# Patient Record
Sex: Female | Born: 1990 | Race: Black or African American | Hispanic: No | Marital: Single | Smoking: Never smoker
Health system: Southern US, Community
[De-identification: ages and names within clinical notes are randomized; demographics above are authoritative.]

## PROBLEM LIST (undated history)

## (undated) DIAGNOSIS — K219 Gastro-esophageal reflux disease without esophagitis: Secondary | ICD-10-CM

## (undated) DIAGNOSIS — D649 Anemia, unspecified: Secondary | ICD-10-CM

## (undated) DIAGNOSIS — R079 Chest pain, unspecified: Secondary | ICD-10-CM

## (undated) DIAGNOSIS — D219 Benign neoplasm of connective and other soft tissue, unspecified: Secondary | ICD-10-CM

## (undated) DIAGNOSIS — J45909 Unspecified asthma, uncomplicated: Secondary | ICD-10-CM

## (undated) HISTORY — PX: UPPER GI ENDOSCOPY: SHX6162

## (undated) HISTORY — PX: WISDOM TOOTH EXTRACTION: SHX21

## (undated) HISTORY — PX: TONSILLECTOMY: SUR1361

---

## 2012-02-27 ENCOUNTER — Encounter (HOSPITAL_COMMUNITY): Payer: Self-pay | Admitting: Emergency Medicine

## 2012-02-27 ENCOUNTER — Emergency Department (HOSPITAL_COMMUNITY)
Admission: EM | Admit: 2012-02-27 | Discharge: 2012-02-27 | Disposition: A | Discharge status: Home / Self Care | Payer: 59 | Source: Home / Self Care | Attending: Emergency Medicine | Admitting: Emergency Medicine

## 2012-02-27 DIAGNOSIS — N758 Other diseases of Bartholin's gland: Secondary | ICD-10-CM

## 2012-02-27 DIAGNOSIS — N72 Inflammatory disease of cervix uteri: Secondary | ICD-10-CM

## 2012-02-27 DIAGNOSIS — N7689 Other specified inflammation of vagina and vulva: Secondary | ICD-10-CM

## 2012-02-27 LAB — POCT URINALYSIS DIP (DEVICE)
Leukocytes, UA: NEGATIVE
Protein, ur: 30 mg/dL — AB
Specific Gravity, Urine: >=1.03 (ref 1.005–1.030)
pH: 6 (ref 5.0–8.0)

## 2012-02-27 LAB — POCT PREGNANCY, URINE: Preg Test, Ur: NEGATIVE

## 2012-02-27 NOTE — ED Notes (Signed)
Reports a cyst in vaginal area.  Reports minimal discharge that patient reports as "normal " for her.  Cyst is reported as present for months

## 2012-02-27 NOTE — ED Provider Notes (Signed)
History     CSN: [id number]  Arrival date & time 02/27/12  1330   First MD Initiated Contact with Patient 02/27/12 1343      Chief Complaint  Patient presents with  . Cyst    (Consider location/radiation/quality/duration/timing/severity/associated sxs/prior treatment) HPI Comments: Patient presents this afternoon to urgent care complaining of an ongoing or recurrent cyst in her vaginal area that comes and go for several months. The last few days she felt any new lead develop cyst and then a significant amount of drainage after that. Comes in because she wants to be checked also know what this cyst was. Patient denies any vaginal bleeding, pelvic pain fevers and only admits to a discrete vaginal discharge.  The history is provided by the patient.    History reviewed. No pertinent past medical history.  History reviewed. No pertinent past surgical history.  No family history on file.  History  Substance Use Topics  . Smoking status: Never Smoker   . Smokeless tobacco: Not on file  . Alcohol Use: Yes    OB History    Grav Para Term Preterm Abortions TAB SAB Ect Mult Living                  Review of Systems  Constitutional: Negative for fever, activity change and fatigue.  Genitourinary: Positive for vaginal discharge and vaginal pain. Negative for dysuria, frequency, flank pain, decreased urine volume, vaginal bleeding, genital sores and pelvic pain.  Musculoskeletal: Negative for arthralgias.    Allergies  Shellfish allergy  Home Medications   Current Outpatient Rx  Name Route Sig Dispense Refill  . OMEPRAZOLE 20 MG PO CPDR Oral Take 20 mg by mouth daily.      BP 147/84  Pulse 68  Temp 99.3 F (37.4 C) (Oral)  Resp 16  SpO2 100%  LMP 02/07/2012  Physical Exam  Nursing note and vitals reviewed. Constitutional: Vital signs are normal. She appears well-developed and well-nourished.  Non-toxic appearance. She does not have a sickly appearance. She does not  appear ill. No distress.  HENT:  Head: Normocephalic.  Pulmonary/Chest: Effort normal and breath sounds normal. She has no decreased breath sounds.  Abdominal: Soft. Bowel sounds are normal. She exhibits no distension and no mass. There is no tenderness. There is no rebound and no guarding. Hernia confirmed negative in the left inguinal area.  Genitourinary: Vagina normal. There is tenderness on the left labia. There is no rash, lesion or injury on the left labia.  Lymphadenopathy:       Left: No inguinal adenopathy present.  Neurological: She is alert.  Skin: Skin is warm. No rash noted. No erythema.    ED Course  Procedures (including critical care time)  Labs Reviewed  POCT URINALYSIS DIP (DEVICE) - Abnormal; Notable for the following:    Bilirubin Urine SMALL (*)     Ketones, ur TRACE (*)     Protein, ur 30 (*)     All other components within normal limits  POCT PREGNANCY, URINE   No results found.   1. Bartholin's adenitis       MDM  External genitalia exam did not reveal the presence of a Bartholin cyst or abscess. No genital sores or rashes were seen on exam. Patient admits that she is no longer feeling any bump or swelling in the area.        Jimmie Molly, MD 02/27/12 1630

## 2014-12-13 ENCOUNTER — Encounter (HOSPITAL_COMMUNITY): Payer: Self-pay | Admitting: Emergency Medicine

## 2014-12-13 ENCOUNTER — Emergency Department (INDEPENDENT_AMBULATORY_CARE_PROVIDER_SITE_OTHER)
Admission: EM | Admit: 2014-12-13 | Discharge: 2014-12-13 | Disposition: A | Discharge status: Home / Self Care | Payer: Worker's Compensation | Source: Home / Self Care

## 2014-12-13 DIAGNOSIS — IMO0001 Reserved for inherently not codable concepts without codable children: Secondary | ICD-10-CM

## 2014-12-13 DIAGNOSIS — S6981XA Other specified injuries of right wrist, hand and finger(s), initial encounter: Secondary | ICD-10-CM | POA: Diagnosis not present

## 2014-12-13 LAB — RAPID HIV SCREEN (HIV 1/2 AB+AG)
HIV 1/2 Antibodies: NONREACTIVE
HIV-1 P24 ANTIGEN - HIV24: NONREACTIVE

## 2014-12-13 NOTE — Discharge Instructions (Signed)
Keep area clean and covered. We will call you with results. Follow up with your [facility] provider as directed by your employer. Use your PPE, refrain from placing your hands into sharps containers or attempting to recap needles, etc. Return as needed.

## 2014-12-13 NOTE — ED Notes (Signed)
Per provider advise patient that rapid test was negative. Patient is aware and verbally understands

## 2014-12-13 NOTE — ED Provider Notes (Signed)
CSN: [id number]     Arrival date & time 12/13/14  1805 History   None    Chief Complaint  Patient presents with  . Body Fluid Exposure   (Consider location/radiation/quality/duration/timing/severity/associated sxs/prior Treatment) HPI Comments: Pt states she stuck her hand in sharps container and got stuck with a needle at work, noticed when she went to shred papers and used hand sanitizer and noticed burning sensation. Washed hands thoroughly and notified employer. Incident occurred at ~5:15 pm today, pt is right handed. Noted single puncture wound to right index finger distal aspect, bleeding stopped. Pt has pw covered with bandaid. Pt is right hand dominant, tetanus up to date, has had Hepatitis series completed for work. Pt states she recently had HIV test as well that was negative. Unknown patient source. Discussed plan of care with Dr. Bridgett Larsson and pt. Acute hepatitis and HIV test performed.   The history is provided by the patient. No language interpreter was used.    History reviewed. No pertinent past medical history. History reviewed. No pertinent past surgical history. History reviewed. No pertinent family history. Social History  Substance Use Topics  . Smoking status: Never Smoker   . Smokeless tobacco: None  . Alcohol Use: Yes   OB History    No data available     Review of Systems  Constitutional: Negative for fever, chills and activity change.  HENT: Negative.   Eyes: Negative.   Respiratory: Negative.   Cardiovascular: Negative.   Gastrointestinal: Negative.   Endocrine: Negative.   Genitourinary: Negative.   Musculoskeletal: Negative.   Skin: Positive for wound. Negative for color change, pallor and rash.  Allergic/Immunologic: Negative.   Neurological: Negative.   Hematological: Negative.   Psychiatric/Behavioral: The patient is nervous/anxious.   All other systems reviewed and are negative.   Allergies  Shellfish allergy  Home Medications   Prior to  Admission medications   Medication Sig Start Date End Date Taking? Authorizing Provider  omeprazole (PRILOSEC) 20 MG capsule Take 20 mg by mouth daily.    Historical Provider, MD   BP 137/81 mmHg  Pulse 68  Temp(Src) 97.4 F (36.3 C) (Oral)  Resp 16  SpO2 99%  LMP 11/14/2014 Physical Exam  Constitutional: She is oriented to person, place, and time. Vital signs are normal. She appears well-developed and well-nourished. She is active and cooperative.  Non-toxic appearance. She does not have a sickly appearance. She does not appear ill. She appears distressed.  HENT:  Head: Normocephalic.  Eyes: Pupils are equal, round, and reactive to light.  Neck: Thyromegaly present.  Cardiovascular: Normal rate, regular rhythm, normal heart sounds, intact distal pulses and normal pulses.   Pulses:      Radial pulses are 2+ on the right side, and 2+ on the left side.  Pulmonary/Chest: Effort normal.  Musculoskeletal: Normal range of motion. She exhibits tenderness. She exhibits no edema.       Right hand: She exhibits tenderness. She exhibits normal range of motion, no bony tenderness, normal two-point discrimination, normal capillary refill, no deformity, no laceration and no swelling. Normal sensation noted. Normal strength noted.       Hands: Neurological: She is alert and oriented to person, place, and time. She has normal strength. No cranial nerve deficit or sensory deficit. GCS eye subscore is 4. GCS verbal subscore is 5. GCS motor subscore is 6.  Skin: Skin is warm and dry. No erythema.  Single pw to right index finger as charted on diagram  Psychiatric: Her  speech is normal. Her mood appears anxious.  Nursing note and vitals reviewed.   ED Course  Procedures (including critical care time) Labs Review Labs Reviewed  RAPID HIV SCREEN (HIV 1/2 AB+AG)  HEPATITIS PANEL, ACUTE   Results for orders placed or performed during the hospital encounter of 12/13/14  Rapid HIV screen (HIV 1/2 Ab+Ag)   Result Value Ref Range   HIV-1 P24 Antigen - HIV24 NON REACTIVE NON REACTIVE   HIV 1/2 Antibodies NON REACTIVE NON REACTIVE   Interpretation (HIV Ag Ab)      A non reactive test result means that HIV 1 or HIV 2 antibodies and HIV 1 p24 antigen were not detected in the specimen.    Imaging Review No results found.   MDM   1. Needlestick wound of finger, right, initial encounter     Tetanus is current. Keep wound clean and dry. Watch for s/s of infection(redness, pus drainage, fever, etc. If develops will need further evaluation and antibiotics at that time. Pt called with negative results. To follow up with [facility] regarding additional serial testing. Discussed use of PPE and avoidance of recapping needles or sticking hand into sharps container. Pt verbalized understanding to this provider.   Tori Milks, NP 80/88/11 0315

## 2014-12-13 NOTE — ED Notes (Addendum)
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

## 2014-12-14 ENCOUNTER — Encounter (HOSPITAL_COMMUNITY): Payer: Self-pay | Admitting: Emergency Medicine

## 2014-12-14 ENCOUNTER — Emergency Department (HOSPITAL_COMMUNITY)
Admission: EM | Admit: 2014-12-14 | Discharge: 2014-12-15 | Disposition: A | Discharge status: Home / Self Care | Payer: Commercial Managed Care - HMO | Attending: Emergency Medicine | Admitting: Emergency Medicine

## 2014-12-14 DIAGNOSIS — Z86018 Personal history of other benign neoplasm: Secondary | ICD-10-CM | POA: Diagnosis not present

## 2014-12-14 DIAGNOSIS — Z3202 Encounter for pregnancy test, result negative: Secondary | ICD-10-CM | POA: Insufficient documentation

## 2014-12-14 DIAGNOSIS — Z79818 Long term (current) use of other agents affecting estrogen receptors and estrogen levels: Secondary | ICD-10-CM | POA: Diagnosis not present

## 2014-12-14 DIAGNOSIS — R102 Pelvic and perineal pain: Secondary | ICD-10-CM | POA: Diagnosis present

## 2014-12-14 DIAGNOSIS — N72 Inflammatory disease of cervix uteri: Secondary | ICD-10-CM | POA: Diagnosis not present

## 2014-12-14 HISTORY — DX: Benign neoplasm of connective and other soft tissue, unspecified: D21.9

## 2014-12-14 LAB — URINALYSIS, ROUTINE W REFLEX MICROSCOPIC
BILIRUBIN URINE: NEGATIVE
GLUCOSE, UA: NEGATIVE mg/dL
HGB URINE DIPSTICK: NEGATIVE
Ketones, ur: NEGATIVE mg/dL
Leukocytes, UA: NEGATIVE
Nitrite: NEGATIVE
PH: 5.5 (ref 5.0–8.0)
Protein, ur: NEGATIVE mg/dL
SPECIFIC GRAVITY, URINE: 1.012 (ref 1.005–1.030)
UROBILINOGEN UA: 0.2 mg/dL (ref 0.0–1.0)

## 2014-12-14 LAB — HEPATITIS PANEL, ACUTE
HCV Ab: 0.8 s/co ratio (ref 0.0–0.9)
Hep A IgM: NEGATIVE
Hep B C IgM: NEGATIVE
Hepatitis B Surface Ag: NEGATIVE

## 2014-12-14 LAB — POC URINE PREG, ED: Preg Test, Ur: NEGATIVE

## 2014-12-14 LAB — WET PREP, GENITAL
CLUE CELLS WET PREP: NONE SEEN
TRICH WET PREP: NONE SEEN
YEAST WET PREP: NONE SEEN

## 2014-12-14 MED ORDER — CEFTRIAXONE SODIUM 250 MG IJ SOLR
250.0000 mg | Freq: Once | INTRAMUSCULAR | Status: AC
  Administered 2014-12-15: 250 mg via INTRAMUSCULAR
  Filled 2014-12-14: qty 250

## 2014-12-14 MED ORDER — LIDOCAINE HCL (PF) 1 % IJ SOLN
INTRAMUSCULAR | Status: AC
  Administered 2014-12-15: 0.9 mL
  Filled 2014-12-14: qty 5

## 2014-12-14 MED ORDER — AZITHROMYCIN 250 MG PO TABS
1000.0000 mg | ORAL_TABLET | Freq: Once | ORAL | Status: AC
  Administered 2014-12-15: 1000 mg via ORAL
  Filled 2014-12-14: qty 4

## 2014-12-14 NOTE — Discharge Instructions (Signed)
No intercourse. You were treated for possible vaginal infection. When your cultures and labs come back, you will be contacted. Follow up as needed.    Cervicitis Cervicitis is a soreness and swelling (inflammation) of the cervix. Your cervix is located at the bottom of your uterus. It opens up to the vagina. CAUSES   Sexually transmitted infections (STIs).   Allergic reaction.   Medicines or birth control devices that are put in the vagina.   Injury to the cervix.   Bacterial infections.  RISK FACTORS You are at greater risk if you:  Have unprotected sexual intercourse.  Have sexual intercourse with many partners.  Began sexual intercourse at an early age.  Have a history of STIs. SYMPTOMS  There may be no symptoms. If symptoms occur, they may include:   Gray, white, yellow, or bad-smelling vaginal discharge.   Pain or itching of the area outside the vagina.   Painful sexual intercourse.   Lower abdominal or lower back pain, especially during intercourse.   Frequent urination.   Abnormal vaginal bleeding between periods, after sexual intercourse, or after menopause.   Pressure or a heavy feeling in the pelvis.  DIAGNOSIS  Diagnosis is made after a pelvic exam. Other tests may include:   Examination of any discharge under a microscope (wet prep).   A Pap test.  TREATMENT  Treatment will depend on the cause of cervicitis. If it is caused by an STI, both you and your partner will need to be treated. Antibiotic medicines will be given.  HOME CARE INSTRUCTIONS   Do not have sexual intercourse until your health care provider says it is okay.   Do not have sexual intercourse until your partner has been treated, if your cervicitis is caused by an STI.   Take your antibiotics as directed. Finish them even if you start to feel better.  SEEK MEDICAL CARE IF:  Your symptoms come back.   You have a fever.  MAKE SURE YOU:   Understand these  instructions.  Will watch your condition.  Will get help right away if you are not doing well or get worse. Document Released: 04/15/2005 Document Revised: 04/20/2013 Document Reviewed: 10/07/2012 [facility] Patient Information 2015 [place], [place]. This information is not intended to replace advice given to you by your health care provider. Make sure you discuss any questions you have with your health care provider.

## 2014-12-14 NOTE — ED Notes (Signed)
Pt presents reporting fibroid diagnosis since April '16, takes [facility] for same. Reports brown spotting during menstrual. Sexual intercourse Saturday and noticed bleeding afterwards and now reports "seeing a bump".

## 2014-12-14 NOTE — ED Provider Notes (Signed)
CSN: [id number]     Arrival date & time 12/14/14  2015 History   First MD Initiated Contact with Patient 12/14/14 2156     No chief complaint on file.    (Consider location/radiation/quality/duration/timing/severity/associated sxs/prior Treatment) HPI Lori Carson is a 24 y.o. female with history of uterine fibroids, presents to emergency department with a rash to her genital area and vaginal pain. Patient states she had pain for the last week. States she had intercourse several days ago during which she states she does not have any pain but had some bleeding after. She states today she looked at her vulva and noted a bump. She denies bump being painful. She denies any urinary symptoms. She says she has had irregular periods for several months. She has been on birth control for 3 months and states she was told she is bleeding because of a fibroid. She denies any fever or chills. No abdominal pain. No back pain. No other complaints.  Past Medical History  Diagnosis Date  . Fibroids    History reviewed. No pertinent past surgical history. No family history on file. Social History  Substance Use Topics  . Smoking status: Never Smoker   . Smokeless tobacco: None  . Alcohol Use: Yes   OB History    No data available     Review of Systems  Constitutional: Negative for fever and chills.  Respiratory: Negative for cough, chest tightness and shortness of breath.   Cardiovascular: Negative for chest pain, palpitations and leg swelling.  Gastrointestinal: Negative for nausea, vomiting, abdominal pain and diarrhea.  Genitourinary: Positive for vaginal bleeding, vaginal discharge and vaginal pain. Negative for dysuria, flank pain and pelvic pain.  Musculoskeletal: Negative for myalgias, arthralgias, neck pain and neck stiffness.  Skin: Negative for rash.  Neurological: Negative for dizziness, weakness and headaches.  All other systems reviewed and are negative.     Allergies   Shellfish allergy  Home Medications   Prior to Admission medications   Medication Sig Start Date End Date Taking? Authorizing Provider  naproxen (NAPROSYN) 250 MG tablet Take 500 mg by mouth 2 (two) times daily as needed for mild pain.   Yes Historical Provider, MD  norethindrone-ethinyl estradiol-iron (MICROGESTIN FE 1.5/30) 1.5-30 MG-MCG tablet Take 1 tablet by mouth every morning.   Yes Historical Provider, MD   BP 139/73 mmHg  Pulse 67  Temp(Src) 98 F (36.7 C) (Oral)  Resp 16  SpO2 100%  LMP 12/12/2014 Physical Exam  Constitutional: She is oriented to person, place, and time. She appears well-developed and well-nourished. No distress.  HENT:  Head: Normocephalic.  Eyes: Conjunctivae are normal.  Neck: Neck supple.  Cardiovascular: Normal rate, regular rhythm and normal heart sounds.   Pulmonary/Chest: Effort normal and breath sounds normal. No respiratory distress. She has no wheezes. She has no rales.  Abdominal: Soft. Bowel sounds are normal. She exhibits no distension. There is no tenderness. There is no rebound.  Genitourinary:  Normal external genitalia. No rash or lesions.  Normal vaginal canal. Small thin white discharge. Cervix is erythematous, closed. No CMT. No uterine or adnexal tenderness. No masses palpated.    Musculoskeletal: She exhibits no edema.  Neurological: She is alert and oriented to person, place, and time.  Skin: Skin is warm and dry.  Psychiatric: She has a normal mood and affect. Her behavior is normal.  Nursing note and vitals reviewed.   ED Course  Procedures (including critical care time) Labs Review Labs Reviewed  WET PREP, GENITAL -  Abnormal; Notable for the following:    WBC, Wet Prep HPF POC MANY (*)    All other components within normal limits  URINALYSIS, ROUTINE W REFLEX MICROSCOPIC (NOT AT [facility])  RPR  HIV ANTIBODY (ROUTINE TESTING)  POC URINE PREG, ED  GC/CHLAMYDIA PROBE AMP ([place]) NOT AT [facility]    Imaging Review No  results found. I have personally reviewed and evaluated these images and lab results as part of my medical decision-making.   EKG Interpretation None      MDM   Final diagnoses:  Cervicitis    patient with vaginal pain and vulvar rash. I did not see any rash to the vulva, no lesions, no pain. It looks normal. Patient did have friable cervix on exam with many white blood cells on wet prep. I suspect cervicitis. Will treat with Rocephin IM, Zithromax 1 g by mouth. The RPR, HIV, GC and chlamydia sent. Will follow-up with OB/GYN. Patient otherwise nontoxic-appearing, afebrile, normal vital signs, and evidence twice a day.  Filed Vitals:   12/14/14 2048  BP: 139/73  Pulse: 67  Temp: 98 F (36.7 C)  TempSrc: Oral  Resp: 16  SpO2: 100%     Jeannett Senior, PA-C 12/15/14 0053  Julianne Rice, MD 12/15/14 [phone]

## 2014-12-14 NOTE — ED Notes (Signed)
Pelvic cart at bedside. 

## 2014-12-15 LAB — HIV ANTIBODY (ROUTINE TESTING W REFLEX): HIV Screen 4th Generation wRfx: NONREACTIVE

## 2014-12-15 LAB — RPR: RPR Ser Ql: NONREACTIVE

## 2014-12-16 ENCOUNTER — Telehealth (HOSPITAL_BASED_OUTPATIENT_CLINIC_OR_DEPARTMENT_OTHER): Payer: Self-pay | Admitting: Emergency Medicine

## 2014-12-16 LAB — GC/CHLAMYDIA PROBE AMP (~~LOC~~) NOT AT ARMC
CHLAMYDIA, DNA PROBE: NEGATIVE
NEISSERIA GONORRHEA: NEGATIVE

## 2014-12-16 NOTE — ED Notes (Signed)
Patient presented to the [facility] , inquiring about her lab reports. After verifying ID, discussed reports

## 2016-04-11 ENCOUNTER — Other Ambulatory Visit: Payer: Self-pay | Admitting: Obstetrics and Gynecology

## 2016-05-01 NOTE — H&P (Signed)
Lori Carson is a 26 y.o.  female G:0  who  presents for an abdominal myomectomy and ovarian cystectomy because of symptomatic uterine fibroids and a complex right ovarian cyst.  For the past 2 years the patient has had progressively worsening dysmenorrhea such that she would occasionally have to be seen in the Emergency Department for relief.  Additionally she would bleed  up to two consecutive months accompanied by pressure with urination and a need to change a pad every 1-2 hours.  She admits to clotting and rates cramping at 10/10 on a 10 point pain scale.  Fortunately her cramping that begins a week beore her period  responds well  to Ibuprofen 800 mg or Naproxen 500 mg.  She denies any changes in bowel function or dyspareunia but admits to lower back pain.  A sono-hysterogram in October 2017 revealed: uterus: 5.90 x 4.17 x 0.96 cm; endometrium-9.51 mm with no masses;  #3 intramural  fibroids: right-2.58 cm;   left 1.53 cm, left lateral-4.64 cm and right pedunculated-2.17 cm;  left ovary-3.76 cm and right ovary-4.81 cm with a complex 2.2 cm cyst.   The patient was placed on continuous Nuva Ring early 2017 to manage her bleeding however,  it had no effect and cramping increased.   She was then placed on Provera 10 mg daily with no effect on bleeding or cramping.  A review of both medical and surgical management options were given to the patient for he symptoms however,  she wishes to proceed with myomectomy and removal of ovarian cyst.   Past Medical History  OB History: G:0  GYN History: menarche: 26 YO    LMP: see HPI    Contracepton condoms  The patient reports a past history of: chlamydia.  Denies history of abnormal PAP smear.  Last PAP smear- 2017 ASCUS with negative HPV  Medical History: Asthma, Gastroesophageal Reflux Disease and  Hiatal Hernia  Surgical History: 1998  Tonsillectomy  Denies problems with anesthesia or history of blood transfusions  Family History: Asthma, Diabetes  Mellitus, Stroke, Gallbladder Cancer, Breast Cancer and Heart Disease  Social History: Single and employed as a Psychologist, sport and exercise;  Denies tobacco use but admits to occasional alcohol use   Medications: Omeprazole 20 mg daily prn Naproxen 500 mg pc bid prn  No Known Allergies   Denies sensitivity to peanuts, shellfish, soy, latex or adhesives.   ROS: Admits to wearing glasses, scratchy throat and chest pain both related to reflux; recent pityriasis rosea-mostly resolved but  denies headache, vision changes, nasal congestion, dysphagia, tinnitus, dizziness,  cough,   shortness of breath,  nausea, vomiting, diarrhea,constipation,  urinary frequency, urgency,  hematuria, vaginitis symptoms, pelvic pain, swelling of joints,easy bruising,  myalgias, arthralgias,  unexplained weight loss and except as is mentioned in the history of present illness, patient's review of systems is otherwise negative.  Physical Exam  Bp: 100/66   P: 64 bpm    R: 16   Temperature: 98.9 degrees F orally  Weight: 182 lbs. Height: 5'7" BMI: 28.5  Neck: supple without masses or thyromegaly Lungs: clear to auscultation Heart: regular rate and rhythm Abdomen: soft, diffusely tender without guarding  and no organomegaly Pelvic:EGBUS- wnl; vagina-normal rugae; uterus-10-12 weeks l size and irregular, cervix without lesions or motion tenderness; adnexae-no tenderness or masses Extremities:  no clubbing, cyanosis or edema   Assesment: Symptomatic Uterine Fibroids           Menorrhagia  Dysmenorrhea           Complex  Right Ovarian Cyst   Disposition:  A discussion was held with patient regarding the indication for her procedure(s) along with the risks, which include but are not limited [id number] to anesthesia, damage to adjacent organs, infection, excessive bleeding and if the endometrial cavity is breached, a C-section delivery will be recommended. . The patient verbalized understanding of these risks and  has consented to proceed with an Abdominal Myomectomy and Removal of a Complex Right Ovarian Cyst at [facility] on May 14, 2016.  CSN# [id number]   Edison Wollschlager J. Florene Glen, PA-C  for Dr.  Harvie Bridge. Mancel Bale

## 2016-05-02 NOTE — Patient Instructions (Signed)
Your procedure is scheduled on:  Tuesday, Jan. 16, 2018  Enter through the [employer] of [facility] at:  7:00 AM  Pick up the phone at the desk and dial [phone].  Call this number if you have problems the morning of surgery: [phone].  Remember: Do NOT eat food or drink after:  Midnight Monday, Jan. 15, 2018  Take these medicines the morning of surgery with a SIP OF WATER:  Omeprazole  Stop ALL herbal medications at this time  Do NOT smoke the day of surgery.  Do NOT wear jewelry (body piercing), metal hair clips/bobby pins, make-up, or nail polish. Do NOT wear lotions, powders, or perfumes.  You may wear deodorant. Do NOT shave for 48 hours prior to surgery. Do NOT bring valuables to the hospital. Contacts, dentures, or bridgework may not be worn into surgery.  Leave suitcase in car.  After surgery it may be brought to your room.  For patients admitted to the hospital, checkout time is 11:00 AM the day of discharge.  Bring a copy of your healthcare power of attorney and living will documents.

## 2016-05-03 ENCOUNTER — Inpatient Hospital Stay (HOSPITAL_COMMUNITY)
Admission: RE | Admit: 2016-05-03 | Discharge: 2016-05-03 | Disposition: A | Discharge status: Home / Self Care | Payer: Self-pay | Source: Ambulatory Visit

## 2016-06-21 NOTE — Patient Instructions (Signed)
Your procedure is scheduled on:  Monday, July 08, 2016  Enter through the [employer] of [facility] at:  7:00 AM  Pick up the phone at the desk and dial [phone].  Call this number if you have problems the morning of surgery: [phone].  Remember: Do NOT eat food or drink after:  Midnight Sunday, July 07, 2016  Take these medicines the morning of surgery with a SIP OF WATER:  Omeprazole  Stop ALL herbal medications at this time  Do NOT smoke the day of surgery.  Do NOT wear jewelry (body piercing), metal hair clips/bobby pins, make-up, or nail polish. Do NOT wear lotions, powders, or perfumes.  You may wear deodorant. Do NOT shave for 48 hours prior to surgery. Do NOT bring valuables to the hospital. Contacts, dentures, or bridgework may not be worn into surgery.  Leave suitcase in car.  After surgery it may be brought to your room.  For patients admitted to the hospital, checkout time is 11:00 AM the day of discharge.  Bring a copy of your healthcare power of attorney and living will documents.  **Effective Friday, Jan. 12, 2018, [place] will implement no hospital visitations from children age 78 and younger due to a steady increase in flu activity in our community and hospitals. **

## 2016-06-24 ENCOUNTER — Encounter (HOSPITAL_COMMUNITY): Payer: Self-pay

## 2016-06-24 ENCOUNTER — Encounter (HOSPITAL_COMMUNITY)
Admission: RE | Admit: 2016-06-24 | Discharge: 2016-06-24 | Disposition: A | Discharge status: Home / Self Care | Payer: Commercial Managed Care - HMO | Source: Ambulatory Visit | Attending: Obstetrics and Gynecology | Admitting: Obstetrics and Gynecology

## 2016-06-24 DIAGNOSIS — N920 Excessive and frequent menstruation with regular cycle: Secondary | ICD-10-CM | POA: Insufficient documentation

## 2016-06-24 DIAGNOSIS — N83209 Unspecified ovarian cyst, unspecified side: Secondary | ICD-10-CM | POA: Diagnosis not present

## 2016-06-24 DIAGNOSIS — Z01812 Encounter for preprocedural laboratory examination: Secondary | ICD-10-CM | POA: Diagnosis not present

## 2016-06-24 DIAGNOSIS — D259 Leiomyoma of uterus, unspecified: Secondary | ICD-10-CM | POA: Diagnosis not present

## 2016-06-24 HISTORY — DX: Gastro-esophageal reflux disease without esophagitis: K21.9

## 2016-06-24 HISTORY — DX: Anemia, unspecified: D64.9

## 2016-06-24 HISTORY — DX: Unspecified asthma, uncomplicated: J45.909

## 2016-06-24 HISTORY — DX: Chest pain, unspecified: R07.9

## 2016-06-24 LAB — TYPE AND SCREEN
ABO/RH(D): B POS
ANTIBODY SCREEN: NEGATIVE

## 2016-06-24 LAB — CBC
HEMATOCRIT: 35.7 % — AB (ref 36.0–46.0)
Hemoglobin: 11.7 g/dL — ABNORMAL LOW (ref 12.0–15.0)
MCH: 26.6 pg (ref 26.0–34.0)
MCHC: 32.8 g/dL (ref 30.0–36.0)
MCV: 81.1 fL (ref 78.0–100.0)
Platelets: 310 10*3/uL (ref 150–400)
RBC: 4.4 MIL/uL (ref 3.87–5.11)
RDW: 15.1 % (ref 11.5–15.5)
WBC: 7.3 10*3/uL (ref 4.0–10.5)

## 2016-06-24 LAB — ABO/RH: ABO/RH(D): B POS

## 2016-07-04 DIAGNOSIS — K219 Gastro-esophageal reflux disease without esophagitis: Secondary | ICD-10-CM | POA: Insufficient documentation

## 2016-07-04 DIAGNOSIS — K449 Diaphragmatic hernia without obstruction or gangrene: Secondary | ICD-10-CM | POA: Insufficient documentation

## 2016-07-04 NOTE — H&P (Signed)
Lori Carson is a 26 y.o.  female, G:0  who presents for myomectomy  and removal of a complex right ovarian cyst because of symptomatic uterine fibroids and menorrhagia.  For the past 2 years the patient reports a menstrual flow lasting 8 days with pad change every 1.5 hour accompanied by clots. She will also experience cramping rated 8/10 on a 10 point pain scale but finds relief to 3/10 with Naproxen 500 mg.  She was given a trial of Nuvaring, Provera and Lysteda over the years for management however,  all of them increased her bleeding volume and duration.  She denies any changes in her bowel or bladder function,  though she has significant pressure with urination.  Has no dyspareunia or lower back pain.   A sono-hysterogram in October 2017 revealed an anteverted  uterus: 5.90 x 4.7 x 0.96 cm, endometrium: 9.5 mm with no masses;  #3 intramural fibroids: right-2.58 cm,  left-1.53 cm,   left lateral-4.64 cm and a right pedunculated fibroid-2.17 cm; left ovary: 3.76 cm and right ovary: 4.81 cm containing a complex 2.2 cm cyst.   Given the negative impact of pharmaceutical therapies on her symptoms the patient has decided to proceed with surgical removal of her fibroids and right ovarian cyst.   Past Medical History  OB History: G:0  GYN History: menarche: 27 YO    LMP: 06/18/16    Contracepton condoms  The patient reports a past history of: chlamydia.  Denies history of abnormal PAP smear.   Last PAP smear:  ASCUS with negative HPV  Medical History: Hiatal Hernia, GERD and Asthma   Surgical History:  Negative Denies  history of blood transfusions  Family History: Asthma, Diabetes Mellitus,  Cancer (Breast, Prostate and Gallbladder), and Stroke  Social History:  Single and employed as a Psychologist, sport and exercise;  Denies tobacco use and consumes alcohol   Medications: Naproxen 500 mg  1 po pc bid prn Omeprazole 20 mg  daily prn Albuterol Inhaler 2 puffs every 6 hours prn   No Known Allergies     Sensitivities:  Shellfish (as a child caused throat to become irritated) and Latex causes itching Denies sensitivity to peanuts, soy or adhesives  ROS: Admits to glasses, wears a retainer, chest pain related to GERD & hiatal hernia, occasional left arm paresthesias but   denies headache, vision changes, nasal congestion, dysphagia, tinnitus, dizziness, hoarseness, cough,  shortness of breath, nausea, vomiting, diarrhea,constipation,  urinary frequency, urgency  dysuria, hematuria, vaginitis symptoms,  swelling of joints,easy bruising,  myalgias, arthralgias, skin rashes, unexplained weight loss and except as is mentioned in the history of present illness, patient's review of systems is otherwise negative.    Physical Exam  Bp: 106/60     P: 64 bpm    R:16  Temperature: 98.8 degrees F orally   Weight:: 184 lbs.  Height: 5'7"  BMI: 28.8  Neck: supple without masses or thyromegaly Lungs: clear to auscultation Heart: regular rate and rhythm Abdomen: soft, non-tender and no organomegaly Pelvic:EGBUS- wnl; vagina-normal rugae; uterus- 12 weeks size and irregular, cervix without lesions or motion tenderness; adnexae-no tenderness or masses Extremities:  no clubbing, cyanosis or edema   Assesment: Symptomatic Uterine Fibroids            Menorrhagia                       Complex Right Ovarian Cyst   Disposition:  A discussion was held with patient regarding the indication  for her procedure(s) along with the risks, which include but are not limited to:  reaction to anesthesia, damage to adjacent organs, infection, excessive bleeding and if the endometrial cavity is breached, a C-section delivery will be recommended. . The patient verbalized understanding of these risks and has consented to proceed with an Abdominal Myomectomy and Removal of a Complex Right Ovarian Cyst at [place] on July 08, 2016.     CSN# [id number]   [place] J. Florene Glen, PA-C  for Dr. Harvie Bridge.  Mancel Bale

## 2016-07-08 ENCOUNTER — Inpatient Hospital Stay (HOSPITAL_COMMUNITY): Payer: Commercial Managed Care - HMO | Admitting: Anesthesiology

## 2016-07-08 ENCOUNTER — Inpatient Hospital Stay (HOSPITAL_COMMUNITY)
Admission: RE | Admit: 2016-07-08 | Discharge: 2016-07-10 | DRG: 743 | Disposition: A | Discharge status: Home / Self Care | Payer: Commercial Managed Care - HMO | Source: Ambulatory Visit | Attending: Obstetrics and Gynecology | Admitting: Obstetrics and Gynecology

## 2016-07-08 ENCOUNTER — Encounter (HOSPITAL_COMMUNITY): Payer: Self-pay | Admitting: *Deleted

## 2016-07-08 ENCOUNTER — Encounter (HOSPITAL_COMMUNITY)
Admission: RE | Disposition: A | Discharge status: Home / Self Care | Payer: Self-pay | Source: Ambulatory Visit | Attending: Obstetrics and Gynecology

## 2016-07-08 DIAGNOSIS — R339 Retention of urine, unspecified: Secondary | ICD-10-CM | POA: Diagnosis not present

## 2016-07-08 DIAGNOSIS — N83291 Other ovarian cyst, right side: Secondary | ICD-10-CM | POA: Diagnosis present

## 2016-07-08 DIAGNOSIS — N838 Other noninflammatory disorders of ovary, fallopian tube and broad ligament: Secondary | ICD-10-CM | POA: Diagnosis present

## 2016-07-08 DIAGNOSIS — D649 Anemia, unspecified: Secondary | ICD-10-CM | POA: Diagnosis present

## 2016-07-08 DIAGNOSIS — N809 Endometriosis, unspecified: Secondary | ICD-10-CM | POA: Insufficient documentation

## 2016-07-08 DIAGNOSIS — N920 Excessive and frequent menstruation with regular cycle: Secondary | ICD-10-CM | POA: Diagnosis present

## 2016-07-08 DIAGNOSIS — N803 Endometriosis of pelvic peritoneum: Secondary | ICD-10-CM | POA: Diagnosis present

## 2016-07-08 DIAGNOSIS — N946 Dysmenorrhea, unspecified: Secondary | ICD-10-CM | POA: Diagnosis present

## 2016-07-08 DIAGNOSIS — D259 Leiomyoma of uterus, unspecified: Secondary | ICD-10-CM | POA: Diagnosis present

## 2016-07-08 HISTORY — PX: MYOMECTOMY: SHX85

## 2016-07-08 HISTORY — PX: OVARIAN CYST REMOVAL: SHX89

## 2016-07-08 LAB — PREGNANCY, URINE: Preg Test, Ur: NEGATIVE

## 2016-07-08 SURGERY — MYOMECTOMY, ABDOMINAL APPROACH
Anesthesia: General

## 2016-07-08 MED ORDER — HYDROMORPHONE 1 MG/ML IV SOLN
INTRAVENOUS | Status: DC
  Administered 2016-07-08: 14:00:00 via INTRAVENOUS
  Administered 2016-07-08: 3 mg via INTRAVENOUS
  Administered 2016-07-09: 1.2 mg via INTRAVENOUS
  Administered 2016-07-09: 1.5 mg via INTRAVENOUS
  Filled 2016-07-08: qty 25

## 2016-07-08 MED ORDER — ONDANSETRON HCL 4 MG PO TABS: 4.0000 mg | ORAL_TABLET | Freq: Three times a day (TID) | ORAL | Status: DC | PRN

## 2016-07-08 MED ORDER — DOCUSATE SODIUM 100 MG PO CAPS
100.0000 mg | ORAL_CAPSULE | Freq: Two times a day (BID) | ORAL | Status: DC
  Administered 2016-07-08 – 2016-07-10 (×5): 100 mg via ORAL
  Filled 2016-07-08 (×5): qty 1

## 2016-07-08 MED ORDER — FENTANYL CITRATE (PF) 250 MCG/5ML IJ SOLN
INTRAMUSCULAR | Status: AC
  Filled 2016-07-08: qty 5

## 2016-07-08 MED ORDER — KETOROLAC TROMETHAMINE 30 MG/ML IJ SOLN
INTRAMUSCULAR | Status: AC
  Filled 2016-07-08: qty 1

## 2016-07-08 MED ORDER — GLYCOPYRROLATE 0.2 MG/ML IJ SOLN
INTRAMUSCULAR | Status: AC
  Filled 2016-07-08: qty 3

## 2016-07-08 MED ORDER — ROCURONIUM BROMIDE 100 MG/10ML IV SOLN
INTRAVENOUS | Status: DC | PRN
  Administered 2016-07-08: 30 mg via INTRAVENOUS
  Administered 2016-07-08 (×2): 10 mg via INTRAVENOUS

## 2016-07-08 MED ORDER — ONDANSETRON HCL 4 MG/2ML IJ SOLN
INTRAMUSCULAR | Status: DC | PRN
  Administered 2016-07-08 (×2): 2 mg via INTRAVENOUS

## 2016-07-08 MED ORDER — HYDROMORPHONE HCL 1 MG/ML IJ SOLN
INTRAMUSCULAR | Status: AC
  Filled 2016-07-08: qty 2

## 2016-07-08 MED ORDER — NEOSTIGMINE METHYLSULFATE 10 MG/10ML IV SOLN
INTRAVENOUS | Status: DC | PRN
  Administered 2016-07-08: 3 mg via INTRAVENOUS

## 2016-07-08 MED ORDER — ROCURONIUM BROMIDE 100 MG/10ML IV SOLN
INTRAVENOUS | Status: AC
  Filled 2016-07-08: qty 1

## 2016-07-08 MED ORDER — FENTANYL CITRATE (PF) 100 MCG/2ML IJ SOLN
INTRAMUSCULAR | Status: DC | PRN
  Administered 2016-07-08: 50 ug via INTRAVENOUS
  Administered 2016-07-08: 100 ug via INTRAVENOUS
  Administered 2016-07-08 (×3): 50 ug via INTRAVENOUS
  Administered 2016-07-08: 100 ug via INTRAVENOUS

## 2016-07-08 MED ORDER — OXYCODONE-ACETAMINOPHEN 5-325 MG PO TABS
1.0000 | ORAL_TABLET | ORAL | Status: DC | PRN
  Administered 2016-07-09 (×2): 1 via ORAL
  Administered 2016-07-09: 2 via ORAL
  Filled 2016-07-08: qty 1
  Filled 2016-07-08: qty 2
  Filled 2016-07-08: qty 1

## 2016-07-08 MED ORDER — LIDOCAINE HCL (CARDIAC) 20 MG/ML IV SOLN
INTRAVENOUS | Status: AC
  Filled 2016-07-08: qty 5

## 2016-07-08 MED ORDER — MIDAZOLAM HCL 2 MG/2ML IJ SOLN
INTRAMUSCULAR | Status: DC | PRN
  Administered 2016-07-08: .5 mg via INTRAVENOUS
  Administered 2016-07-08: 1.5 mg via INTRAVENOUS

## 2016-07-08 MED ORDER — LACTATED RINGERS IV SOLN
INTRAVENOUS | Status: DC
  Administered 2016-07-08 – 2016-07-09 (×4): via INTRAVENOUS

## 2016-07-08 MED ORDER — PROPOFOL 10 MG/ML IV BOLUS
INTRAVENOUS | Status: DC | PRN
  Administered 2016-07-08: 200 mg via INTRAVENOUS

## 2016-07-08 MED ORDER — GLYCOPYRROLATE 0.2 MG/ML IJ SOLN
INTRAMUSCULAR | Status: DC | PRN
  Administered 2016-07-08: 0.4 mg via INTRAVENOUS
  Administered 2016-07-08 (×2): 0.1 mg via INTRAVENOUS

## 2016-07-08 MED ORDER — DIPHENHYDRAMINE HCL 50 MG/ML IJ SOLN: 12.5000 mg | Freq: Four times a day (QID) | INTRAMUSCULAR | Status: DC | PRN

## 2016-07-08 MED ORDER — ACETAMINOPHEN 160 MG/5ML PO SOLN
975.0000 mg | Freq: Once | ORAL | Status: AC
  Administered 2016-07-08: 975 mg via ORAL

## 2016-07-08 MED ORDER — HYDROMORPHONE HCL 1 MG/ML IJ SOLN
INTRAMUSCULAR | Status: AC
  Administered 2016-07-08: 0.5 mg via INTRAVENOUS
  Filled 2016-07-08: qty 1

## 2016-07-08 MED ORDER — VASOPRESSIN 20 UNIT/ML IV SOLN
INTRAVENOUS | Status: DC | PRN
  Administered 2016-07-08: 16 mL via INTRAMUSCULAR

## 2016-07-08 MED ORDER — LIDOCAINE HCL (CARDIAC) 20 MG/ML IV SOLN
INTRAVENOUS | Status: DC | PRN
  Administered 2016-07-08: 100 mg via INTRAVENOUS

## 2016-07-08 MED ORDER — ONDANSETRON HCL 4 MG/2ML IJ SOLN
4.0000 mg | Freq: Four times a day (QID) | INTRAMUSCULAR | Status: DC | PRN
  Administered 2016-07-08: 4 mg via INTRAVENOUS
  Filled 2016-07-08: qty 2

## 2016-07-08 MED ORDER — DEXAMETHASONE SODIUM PHOSPHATE 10 MG/ML IJ SOLN
INTRAMUSCULAR | Status: DC | PRN
  Administered 2016-07-08: 10 mg via INTRAVENOUS

## 2016-07-08 MED ORDER — CEFAZOLIN SODIUM-DEXTROSE 2-4 GM/100ML-% IV SOLN
2.0000 g | INTRAVENOUS | Status: AC
  Administered 2016-07-08: 2 g via INTRAVENOUS

## 2016-07-08 MED ORDER — NALOXONE HCL 0.4 MG/ML IJ SOLN: 0.4000 mg | INTRAMUSCULAR | Status: DC | PRN

## 2016-07-08 MED ORDER — KETOROLAC TROMETHAMINE 30 MG/ML IJ SOLN
30.0000 mg | Freq: Four times a day (QID) | INTRAMUSCULAR | Status: AC
  Administered 2016-07-08 – 2016-07-09 (×4): 30 mg via INTRAVENOUS
  Filled 2016-07-08 (×4): qty 1

## 2016-07-08 MED ORDER — PANTOPRAZOLE SODIUM 40 MG PO TBEC
40.0000 mg | DELAYED_RELEASE_TABLET | Freq: Every day | ORAL | Status: DC
  Administered 2016-07-08 – 2016-07-10 (×3): 40 mg via ORAL
  Filled 2016-07-08 (×3): qty 1

## 2016-07-08 MED ORDER — MIDAZOLAM HCL 2 MG/2ML IJ SOLN
INTRAMUSCULAR | Status: AC
  Filled 2016-07-08: qty 2

## 2016-07-08 MED ORDER — DEXAMETHASONE SODIUM PHOSPHATE 4 MG/ML IJ SOLN
INTRAMUSCULAR | Status: AC
  Filled 2016-07-08: qty 1

## 2016-07-08 MED ORDER — DIPHENHYDRAMINE HCL 12.5 MG/5ML PO ELIX
12.5000 mg | ORAL_SOLUTION | Freq: Four times a day (QID) | ORAL | Status: DC | PRN
  Administered 2016-07-09: 12.5 mg via ORAL
  Filled 2016-07-08: qty 5

## 2016-07-08 MED ORDER — HYDROMORPHONE HCL 1 MG/ML IJ SOLN
0.2500 mg | INTRAMUSCULAR | Status: DC | PRN
  Administered 2016-07-08 (×2): 0.5 mg via INTRAVENOUS

## 2016-07-08 MED ORDER — ONDANSETRON HCL 4 MG/2ML IJ SOLN
INTRAMUSCULAR | Status: AC
  Filled 2016-07-08: qty 2

## 2016-07-08 MED ORDER — SCOPOLAMINE 1 MG/3DAYS TD PT72
1.0000 | MEDICATED_PATCH | Freq: Once | TRANSDERMAL | Status: DC
  Administered 2016-07-08: 1.5 mg via TRANSDERMAL

## 2016-07-08 MED ORDER — HYDROMORPHONE HCL 1 MG/ML IJ SOLN
INTRAMUSCULAR | Status: DC | PRN
  Administered 2016-07-08 (×2): 1 mg via INTRAVENOUS

## 2016-07-08 MED ORDER — SODIUM CHLORIDE 0.9% FLUSH: 9.0000 mL | INTRAVENOUS | Status: DC | PRN

## 2016-07-08 MED ORDER — LACTATED RINGERS IV SOLN
INTRAVENOUS | Status: DC
Start: 2016-07-08 — End: 2016-07-08
  Administered 2016-07-08 (×3): via INTRAVENOUS

## 2016-07-08 MED ORDER — PROPOFOL 10 MG/ML IV BOLUS
INTRAVENOUS | Status: AC
  Filled 2016-07-08: qty 20

## 2016-07-08 MED ORDER — IBUPROFEN 600 MG PO TABS
600.0000 mg | ORAL_TABLET | Freq: Four times a day (QID) | ORAL | Status: DC | PRN
  Administered 2016-07-10: 600 mg via ORAL
  Filled 2016-07-08: qty 1

## 2016-07-08 MED ORDER — MENTHOL 3 MG MT LOZG
1.0000 | LOZENGE | OROMUCOSAL | Status: DC | PRN
  Administered 2016-07-09 (×2): 3 mg via ORAL
  Filled 2016-07-08: qty 9

## 2016-07-08 MED ORDER — KETOROLAC TROMETHAMINE 30 MG/ML IJ SOLN
INTRAMUSCULAR | Status: DC | PRN
  Administered 2016-07-08: 30 mg via INTRAVENOUS

## 2016-07-08 SURGICAL SUPPLY — 48 items
BARRIER ADHS 3X4 INTERCEED (GAUZE/BANDAGES/DRESSINGS) ×2 IMPLANT
CANISTER SUCT 3000ML (MISCELLANEOUS) ×2 IMPLANT
CLOTH BEACON ORANGE TIMEOUT ST (SAFETY) ×2 IMPLANT
CONT PATH 16OZ SNAP LID 3702 (MISCELLANEOUS) ×2 IMPLANT
DECANTER SPIKE VIAL GLASS SM (MISCELLANEOUS) ×2 IMPLANT
DISSECTOR ROUND CHERRY 3/8 STR (MISCELLANEOUS) ×2 IMPLANT
DRSG OPSITE POSTOP 4X10 (GAUZE/BANDAGES/DRESSINGS) ×2 IMPLANT
DRSG TELFA 3X8 NADH (GAUZE/BANDAGES/DRESSINGS) ×2 IMPLANT
DURAPREP 26ML APPLICATOR (WOUND CARE) ×2 IMPLANT
ELECT CAUTERY BLADE 6.4 (BLADE) IMPLANT
ELECT NEEDLE TIP 2.8 STRL (NEEDLE) IMPLANT
GAUZE SPONGE 4X4 16PLY XRAY LF (GAUZE/BANDAGES/DRESSINGS) ×2 IMPLANT
GLOVE BIO SURGEON STRL SZ7.5 (GLOVE) ×2 IMPLANT
GLOVE BIOGEL PI IND STRL 7.0 (GLOVE) ×3 IMPLANT
GLOVE BIOGEL PI IND STRL 7.5 (GLOVE) ×1 IMPLANT
GLOVE BIOGEL PI INDICATOR 7.0 (GLOVE) ×3
GLOVE BIOGEL PI INDICATOR 7.5 (GLOVE) ×1
GOWN STRL REUS W/TWL LRG LVL3 (GOWN DISPOSABLE) ×6 IMPLANT
HEMOSTAT SURGICEL 2X14 (HEMOSTASIS) IMPLANT
NEEDLE HYPO 22GX1.5 SAFETY (NEEDLE) ×2 IMPLANT
NS IRRIG 1000ML POUR BTL (IV SOLUTION) ×2 IMPLANT
PACK ABDOMINAL GYN (CUSTOM PROCEDURE TRAY) ×2 IMPLANT
PAD OB MATERNITY 4.3X12.25 (PERSONAL CARE ITEMS) ×2 IMPLANT
PENCIL SMOKE EVAC W/HOLSTER (ELECTROSURGICAL) IMPLANT
PROTECTOR NERVE ULNAR (MISCELLANEOUS) ×2 IMPLANT
SPONGE LAP 18X18 X RAY DECT (DISPOSABLE) ×2 IMPLANT
SPONGE SURGIFOAM ABS GEL 12-7 (HEMOSTASIS) IMPLANT
STAPLER VISISTAT 35W (STAPLE) IMPLANT
STRIP CLOSURE SKIN 1/2X4 (GAUZE/BANDAGES/DRESSINGS) ×2 IMPLANT
SUT CHROMIC 2 0 CT 1 (SUTURE) ×2 IMPLANT
SUT MNCRL AB 3-0 PS2 27 (SUTURE) ×2 IMPLANT
SUT PDS AB 1 CTX 36 (SUTURE) IMPLANT
SUT PLAIN 2 0 XLH (SUTURE) IMPLANT
SUT VIC AB 0 CT1 18XCR BRD8 (SUTURE) IMPLANT
SUT VIC AB 0 CT1 27 (SUTURE) ×4
SUT VIC AB 0 CT1 27XBRD ANBCTR (SUTURE) ×4 IMPLANT
SUT VIC AB 0 CT1 8-18 (SUTURE)
SUT VIC AB 0 CTX 36 (SUTURE)
SUT VIC AB 0 CTX36XBRD ANBCTRL (SUTURE) IMPLANT
SUT VIC AB 2-0 CT1 27 (SUTURE)
SUT VIC AB 2-0 CT1 TAPERPNT 27 (SUTURE) IMPLANT
SUT VIC AB 2-0 SH 27 (SUTURE) ×1
SUT VIC AB 2-0 SH 27XBRD (SUTURE) ×1 IMPLANT
SUT VIC AB 3-0 SH 27 (SUTURE) ×5
SUT VIC AB 3-0 SH 27X BRD (SUTURE) ×5 IMPLANT
SYR CONTROL 10ML LL (SYRINGE) ×2 IMPLANT
TOWEL OR 17X24 6PK STRL BLUE (TOWEL DISPOSABLE) ×4 IMPLANT
TRAY FOLEY CATH SILVER 14FR (SET/KITS/TRAYS/PACK) ×2 IMPLANT

## 2016-07-08 NOTE — Anesthesia Procedure Notes (Signed)
Procedure Name: Intubation Date/Time: 07/08/2016 8:45 AM Performed by: Lyndle Herrlich Pre-anesthesia Checklist: Patient identified, Patient being monitored, Timeout performed, Emergency Drugs available and Suction available Patient Re-evaluated:Patient Re-evaluated prior to inductionOxygen Delivery Method: Circle System Utilized Preoxygenation: Pre-oxygenation with 100% oxygen Intubation Type: IV induction Ventilation: Mask ventilation without difficulty Laryngoscope Size: Mac, Sabra Heck and 2 Grade View: Grade II Tube type: Oral Tube size: 7.0 mm Number of attempts: 1 Airway Equipment and Method: stylet Placement Confirmation: ETT inserted through vocal cords under direct vision,  positive ETCO2 and breath sounds checked- equal and bilateral Secured at: 21 cm Tube secured with: Tape Dental Injury: Teeth and Oropharynx as per pre-operative assessment

## 2016-07-08 NOTE — Anesthesia Postprocedure Evaluation (Signed)
Anesthesia Post Note  Patient: Lori Carson  Procedure(s) Performed: Procedure(s) (LRB): MYOMECTOMY with peritoneal biopsy (N/A) OVARIAN CYSTECTOMY right, left paratubal cystectomy (N/A)  Patient location during evaluation: [facility] Anesthesia Type: General Level of consciousness: awake and alert and oriented Pain management: pain level controlled Vital Signs Assessment: post-procedure vital signs reviewed and stable Respiratory status: spontaneous breathing, nonlabored ventilation and patient connected to nasal cannula oxygen Cardiovascular status: stable Postop Assessment: no signs of nausea or vomiting and adequate PO intake Anesthetic complications: no        Last Vitals:  Vitals:   07/08/16 1430 07/08/16 1530  BP: 119/67 126/75  Pulse: 72 74  Resp: 18 18  Temp: 36.7 C 36.9 C    Last Pain:  Vitals:   07/08/16 1530  TempSrc: Oral  PainSc:    Pain Goal: Patients Stated Pain Goal: 3 (07/08/16 1430)               Salvatrice Morandi Hristova

## 2016-07-08 NOTE — Interval H&P Note (Signed)
History and Physical Interval Note:  07/08/2016 8:27 AM  Lori Carson  has presented today for surgery, with the diagnosis of Uterine Fibroids, Ovarian Cyst, possible Endometrial Mass  The various methods of treatment have been discussed with the patient and family. After consideration of risks, benefits and other options for treatment, the patient has consented to  Procedure(s): MYOMECTOMY (N/A) OVARIAN CYSTECTOMY (N/A) as a surgical intervention .  The patient's history has been reviewed, patient examined, no change in status, stable for surgery.  I have reviewed the patient's chart and labs.  Questions were answered to the patient's satisfaction.     Delice Lesch

## 2016-07-08 NOTE — Transfer of Care (Signed)
Immediate Anesthesia Transfer of Care Note  Patient: Lori Carson  Procedure(s) Performed: Procedure(s): MYOMECTOMY (N/A) OVARIAN CYSTECTOMY (N/A)  Patient Location: PACU  Anesthesia Type:General  Level of Consciousness: awake, alert , oriented and sedated  Airway & Oxygen Therapy: Patient Spontanous Breathing and Patient connected to nasal cannula oxygen  Post-op Assessment: Report given to RN, Post -op Vital signs reviewed and stable and Patient moving all extremities X 4  Post vital signs: Reviewed and stable  Last Vitals:  Vitals:   07/08/16 0730  BP: 125/83  Pulse: 66  Resp: 18  Temp: 36.9 C    Last Pain:  Vitals:   07/08/16 0730  TempSrc: Oral  PainSc: 0-No pain      Patients Stated Pain Goal: 3 (64/33/29 5188)  Complications: No apparent anesthesia complications

## 2016-07-08 NOTE — Anesthesia Preprocedure Evaluation (Signed)
Anesthesia Evaluation  Patient identified by MRN, date of birth, ID band Patient awake    Reviewed: Allergy & Precautions, H&P , Patient's Chart, lab work & pertinent test results, reviewed documented beta blocker date and time   Airway Mallampati: II  TM Distance: >3 FB Neck ROM: full    Dental no notable dental hx.    Pulmonary asthma ,    Pulmonary exam normal breath sounds clear to auscultation       Cardiovascular  Rhythm:regular Rate:Normal     Neuro/Psych    GI/Hepatic   Endo/Other    Renal/GU      Musculoskeletal   Abdominal   Peds  Hematology   Anesthesia Other Findings   Reproductive/Obstetrics                             Anesthesia Physical Anesthesia Plan  ASA: II  Anesthesia Plan: General   Post-op Pain Management:    Induction: Intravenous  Airway Management Planned: Oral ETT  Additional Equipment:   Intra-op Plan:   Post-operative Plan: Extubation in OR  Informed Consent: I have reviewed the patients History and Physical, chart, labs and discussed the procedure including the risks, benefits and alternatives for the proposed anesthesia with the patient or authorized representative who has indicated his/her understanding and acceptance.   Dental Advisory Given and Dental advisory given  Plan Discussed with: CRNA and Surgeon  Anesthesia Plan Comments: (  Discussed general anesthesia, including possible nausea, instrumentation of airway, sore throat,pulmonary aspiration, etc. I asked if the were any outstanding questions, or  concerns before we proceeded.)        Anesthesia Quick Evaluation

## 2016-07-08 NOTE — Anesthesia Postprocedure Evaluation (Signed)
Anesthesia Post Note  Patient: Avamarie Crossley  Procedure(s) Performed: Procedure(s) (LRB): MYOMECTOMY with peritoneal biopsy (N/A) OVARIAN CYSTECTOMY right, left paratubal cystectomy (N/A)  Patient location during evaluation: PACU Anesthesia Type: General Level of consciousness: sedated Pain management: satisfactory to patient Vital Signs Assessment: post-procedure vital signs reviewed and stable Respiratory status: spontaneous breathing Cardiovascular status: stable Anesthetic complications: no       Last Vitals:  Vitals:   07/08/16 1215 07/08/16 1230  BP: 127/81 123/73  Pulse: 62 66  Resp: 14 15  Temp:      Last Pain:  Vitals:   07/08/16 1211  TempSrc:   PainSc: 4                  Theodore Rahrig EDWARD

## 2016-07-08 NOTE — Op Note (Signed)
Preop Diagnosis: 1.Uterine Fibroids 2.Ovarian Cyst   Postop Diagnosis: 1.Uterine Fibroids 2.Ovarian Cyst 3.Endometriosis  Procedure: 1.ABDOMINAL MYOMECTOMY 2.RIGHT OVARIAN CYSTECTOMY 3.LEFT PARATUBAL CYSTECTOMY 4.PERITONEAL BIOPSY  Anesthesia: General   Anesthesiologist: Lyndle Herrlich, MD   Attending: Everett Graff, MD   Assistant: Earnstine Regal, PA-C  Findings: 3 fibroids - largest approximately 4-5cm  Pathology: 1.Fibroids 2.Right Ovarian Cyst 3.Left Paratubal Cyst  Fluids: 2000 cc  UOP: 400 cc  EBL: 017 cc  Complications: None  Procedure: The patient was taken to the operating room after the risks, benefits and alternatives were discussed with the patient. The patient verbalized understanding and consent signed and witnessed. The patient was placed under general anesthesia per anesthesiologist and prepped and draped in the normal sterile fashion.  Time Out was performed per protocol.  A pfannenstiel skin incision was made and carried down to the underlying layer of fascia which was incised bilaterally on either side of the midline.  The fascial incision was extended bilaterally with the Mayo scissors.  The inferior aspect of the fascial incision was grasped with Kocher clamps and the fascia excised from the rectus muscle.  The same was done on the superior aspect of the fascial incision.  The muscle was separated in the midline and the peritoneum entered bluntly and extended manually.  The bowel was packed away with 3 moist laparotomy sponges and the [employer] retractor placed with the bladder blade.  A towel clamp was placed on the fundal fibroid and the uterus elevated up to the incision.  Vasopressin at a concentration of 20 units of vasopressin in 50cc of normal saline was injected into the anterior wall fibroid and the fibroid shelled out using a hemostat and bovie for cautery.  The same was done for the remaining fibroids.  All beds were repaired with 0 and 2-0  vicryl and the serosa was reapproximated with 3-0 vicryl.  Powder burn marks were noted on the anterior wall of the uterus near the uterovesicle peritoneum.  Hydrodissection was performed and powder burns excised.  The peritoneum was repaired with 3-0 vicryl.  Copious irrigation was performed.  Attention was turned to the right ovary which was grasped with a babcock and an incision made overlying the rt ovarian cyst.  The cyst was shelled out and the bed was cauterized with the bovie until hemostasis was achieved.  A paratubal cyst was noted on the left and removed in a similar fashion.  Intercede was placed on all incisions.  The endometrial cavity was not entered although the 4-5cm fibroid abutted against it.  Three laparotomy sponges were removed and the peritoneum repaired with 2-0 chromic.  The fascia was repaired with 0 vicryl.  The subcutaneous tissue was irrigated and made hemostatic with the bovie.  The skin was reapproximated with 3-0 monocryl and half inch steristrips applied with benzoin.  A honeycomb dressing was placed.  The patient tolerated the procedure well and was returned to the recovery room in good condition.

## 2016-07-08 NOTE — Addendum Note (Signed)
Addendum  created 07/08/16 1633 by Hewitt Blade, CRNA   Sign clinical note

## 2016-07-08 NOTE — Discharge Instructions (Signed)
Call [facility] @ [phone] if:  You have a temperature greater than or equal to 100.4 degrees Farenheit orally You have pain that is not made better by the pain medication given and taken as directed You have excessive bleeding or problems urinating  Take Colace (Docusate Sodium/Stool Softener) 100 mg 2-3 times daily while taking narcotic pain medicine to avoid constipation or until bowel movements are regular. Take Ibuprofen 600 mg with food every 6 hours for 5 days then as needed for pain  You may drive after 2  weeks You may walk up steps  You may shower  You may resume a regular diet  Keep incisions clean and dry and remove honeycomb dressing on 07/14/16 Do not lift over 15 pounds for 6 weeks Avoid anything in vagina for 6 weeks (or until after your post-operative visit)

## 2016-07-08 NOTE — Progress Notes (Addendum)
Day of Surgery Procedure(s) (LRB): MYOMECTOMY with peritoneal biopsy (N/A) OVARIAN CYSTECTOMY right, left paratubal cystectomy (N/A)  Subjective: Patient reports nausea, incisional pain and tolerating PO.    Objective: I have reviewed patient's vital signs and intake and output. UOP 550cc/5hrs  General: alert and no distress Resp: clear to auscultation bilaterally Cardio: regular rate and rhythm GI: soft, nd, app tender, decreased bs, honeycomb dressing in place Extremities: no calf tenderness Vaginal Bleeding: minimal  Assessment: s/p Procedure(s): MYOMECTOMY with peritoneal biopsy (N/A) OVARIAN CYSTECTOMY right, left paratubal cystectomy (N/A): stable  Plan: Encourage ambulation Continue foley due to urinary output monitoring  CBC in am SCDs for DVT prophylaxis IS   LOS: 0 days    Lori Carson Y 07/08/2016, 6:36 PM

## 2016-07-09 ENCOUNTER — Encounter (HOSPITAL_COMMUNITY): Payer: Self-pay | Admitting: Obstetrics and Gynecology

## 2016-07-09 LAB — CBC
HEMATOCRIT: 30.3 % — AB (ref 36.0–46.0)
Hemoglobin: 10.2 g/dL — ABNORMAL LOW (ref 12.0–15.0)
MCH: 28 pg (ref 26.0–34.0)
MCHC: 33.7 g/dL (ref 30.0–36.0)
MCV: 83.2 fL (ref 78.0–100.0)
PLATELETS: 216 10*3/uL (ref 150–400)
RBC: 3.64 MIL/uL — AB (ref 3.87–5.11)
RDW: 14.8 % (ref 11.5–15.5)
WBC: 11.6 10*3/uL — AB (ref 4.0–10.5)

## 2016-07-09 MED ORDER — HYDROMORPHONE HCL 2 MG PO TABS
2.0000 mg | ORAL_TABLET | Freq: Four times a day (QID) | ORAL | Status: DC | PRN
  Administered 2016-07-09: 2 mg via ORAL
  Administered 2016-07-10: 4 mg via ORAL
  Administered 2016-07-10: 2 mg via ORAL
  Filled 2016-07-09: qty 1
  Filled 2016-07-09: qty 2
  Filled 2016-07-09: qty 1

## 2016-07-09 NOTE — Progress Notes (Signed)
Contact with Dr. Mancel Bale regarding patient's inability to void since foley catheter removal. Pt report an urge to void but unable at this time. Orders given for 1 time straight cath and obtain urine specimen for culture. Toya Smothers, RN

## 2016-07-09 NOTE — Progress Notes (Signed)
Pt c/o itching, requesting med.

## 2016-07-09 NOTE — Progress Notes (Signed)
Lori Carson is a67 y.o.  [id number]  Post Op Date # 1: Abdominal Myomectomy, Right Ovarian Cystectomy, Left Paratubal Cystectomy and Peritoneal Biopsy  Subjective: Patient is Doing well postoperatively. Patient has Pain is controlled with current analgesics. Medications being used: prescription NSAID's including ketorolac (Toradol) and narcotic analgesics including hydromorphone (Dilaudid).  Ambulated last evening however experienced some nausea with dry heaves that were made better with crackers and ginger ale.  Hasn't voided since Foley was removed and has  not passed flatus. Reports throat being dry and irritated.  Objective: Vital signs in last 24 hours: Temp:  [97.6 F (36.4 C)-99 F (37.2 C)] 98 F (36.7 C) (03/13 0352) Pulse Rate:  [51-82] 59 (03/13 0352) Resp:  [10-20] 20 (03/13 0614) BP: (113-138)/(46-86) 123/46 (03/13 0352) SpO2:  [88 %-100 %] 99 % (03/13 0614)  Intake/Output from previous day: 03/12 0701 - 03/13 0700 In: 3460 [P.O.:960; I.V.:2500] Out: 2375 [Urine:2175] Intake/Output this shift: No intake/output data recorded.  Recent Labs Lab 07/09/16 0611  WBC 11.6*  HGB 10.2*  HCT 30.3*  PLT 216    No results for input(s): NA, K, CL, CO2, BUN, CREATININE, CALCIUM, PROT, BILITOT, ALKPHOS, ALT, AST, GLUCOSE in the last 168 hours.  Invalid input(s): LABALBU  EXAM: General: alert, cooperative and no distress Resp: clear to auscultation bilaterally Cardio: regular rate and rhythm, S1, S2 normal, no murmur, click, rub or gallop GI: Bowel sound present in all quadrants; honeycomb dressing is clean, dry and intact. Extremities: Homans sign is negative, no sign of DVT and SCD hose in place and functioning.  No calf tenderness. Vaginal Bleeding: Small dry stain in middle of pad.   Assessment: s/p Procedure(s): MYOMECTOMY with peritoneal biopsy OVARIAN CYSTECTOMY right, left paratubal cystectomy:    Plan: Advance diet Encourage ambulation Advance to PO  medication Routine care.  LOS: 1 day    POWELL,ELMIRA, PA-C 07/09/2016 7:43 AM  Agree with above.  Patient passing flatus and tolerating po.  Reports itching and was treated with benadryl.  Will change to po dilaudid.  Voiding spontaneously.  Anticipate d/c tomorrow.

## 2016-07-10 LAB — URINE CULTURE: Culture: NO GROWTH

## 2016-07-10 MED ORDER — IBUPROFEN 600 MG PO TABS: ORAL_TABLET | ORAL | 1 refills | Status: DC

## 2016-07-10 MED ORDER — HYDROMORPHONE HCL 2 MG PO TABS: ORAL_TABLET | ORAL | 0 refills | Status: DC

## 2016-07-10 NOTE — Discharge Summary (Signed)
Physician Discharge Summary  Patient ID: Lori Carson MRN: [id number] DOB/AGE: 26/11/1990 25 y.o.  Admit date: 07/08/2016 Discharge date: 07/10/2016   Discharge Diagnoses:  Symptomatic Uterine Fibroids, Menorrhagia,  Right Ovarian Cyst and Possible Endometriosis Active Problems:   Fibroid, uterine   Operation: Abdominal Myomectomy, Right Ovarian Cystectomy, Left Paratubal Cystectomy and Peritoneal Biopsy   Discharged Condition: Good  Hospital Course: On the date of admission the patient underwent the aforementioned procedures and tolerated them well.  During the procedure, peritoneal implants were observed that had features of endometriosis and were therefore biopsied and sent to pathology.  Post operative course was marked by transient urinary retention however it quickly resolved after the bladder was drained using a straight catheter. By post operative day #2 the patient had resumed bowel and bladder function and was therefore deemed ready for discharge home.  Discharge hemoglobin was 10.3.    Disposition: 01-Home or Self Care  Discharge Medications:  Allergies as of 07/10/2016   No Known Allergies     Medication List    STOP taking these medications   naproxen 250 MG tablet Commonly known as:  NAPROSYN     TAKE these medications   HYDROmorphone 2 MG tablet Commonly known as:  DILAUDID 1-2  po every 6 hours prn-post operative pain   ibuprofen 600 MG tablet Commonly known as:  ADVIL,MOTRIN 1  po pc every 6 hours for 5 days then prn-pain   mometasone 50 MCG/ACT nasal spray Commonly known as:  NASONEX Place 2 sprays into the nose daily as needed (congestion).   multivitamin with minerals Tabs tablet Take 1 tablet by mouth daily.   omeprazole 20 MG capsule Commonly known as:  PRILOSEC Take 20 mg by mouth daily as needed (heartburn).          Follow-up:  Dr. Harvie Bridge. Mancel Bale on  August 06, 2016 at 3 p.m.  SignedEarnstine Regal, PA-C 07/10/2016, 8:10  AM

## 2016-07-10 NOTE — Progress Notes (Signed)
Lori Carson is a41 y.o.  [id number]  Post Op Date # 2: Abdominal Myomectomy, Right Ovarian Cystectomy, Left Paratubal Cystectomy and Peritoneal Biopsy  Subjective: Patient is Doing well postoperatively. Patient has Pain is controlled with current analgesics. Medications being used: prescription NSAID's including Ibuprofen  and narcotic analgesics including Dilaudid. Ambulating in the halls without dizziness, voiding, passing flatus and  tolerating regular diet.    Objective: Vital signs in last 24 hours: Temp:  [98 F (36.7 C)-98.3 F (36.8 C)] 98 F (36.7 C) (03/14 0450) Pulse Rate:  [64-72] 66 (03/14 0450) Resp:  [16-18] 16 (03/14 0450) BP: (106-117)/(59-74) 117/74 (03/14 0450) SpO2:  [96 %-100 %] 100 % (03/14 0450)  Intake/Output from previous day: 03/13 0701 - 03/14 0700 In: 960 [P.O.:960] Out: 2350 [Urine:2350] Intake/Output this shift: No intake/output data recorded.  Recent Labs Lab 07/09/16 0611  WBC 11.6*  HGB 10.2*  HCT 30.3*  PLT 216    No results for input(s): NA, K, CL, CO2, BUN, CREATININE, CALCIUM, PROT, BILITOT, ALKPHOS, ALT, AST, GLUCOSE in the last 168 hours.  Invalid input(s): LABALBU  EXAM: General: alert, cooperative, no distress and complains of being sore. Resp: clear to auscultation bilaterally Cardio: regular rate and rhythm, S1, S2 normal, no murmur, click, rub or gallop GI: Bowel sounds present and soft; honeycomb dressing is clean/dry and intact. Extremities: Homans sign is negative, no sign of DVT and no calf tenderness. Vaginal Bleeding: minimal and dry stain on center of pad.   Assessment: s/p Procedure(s): MYOMECTOMY with peritoneal biopsy OVARIAN CYSTECTOMY right, left paratubal cystectomy: stable, progressing well, tolerating diet and anemia  Plan: Discharge home  LOS: 2 days    Lori Brickman, PA-C 07/10/2016 8:03 AM

## 2016-07-10 NOTE — Progress Notes (Signed)
Discharge instructions given, prescriptions given, questions answered, states understanding, signed and given copy.

## 2016-09-06 ENCOUNTER — Ambulatory Visit (INDEPENDENT_AMBULATORY_CARE_PROVIDER_SITE_OTHER): Payer: Commercial Managed Care - HMO | Admitting: Allergy

## 2016-09-06 ENCOUNTER — Encounter: Payer: Self-pay | Admitting: Allergy

## 2016-09-06 VITALS — BP 112/76 | HR 63 | Temp 98.5°F | Resp 16 | Ht 67.0 in | Wt 180.6 lb

## 2016-09-06 DIAGNOSIS — J309 Allergic rhinitis, unspecified: Secondary | ICD-10-CM | POA: Diagnosis not present

## 2016-09-06 DIAGNOSIS — Z91018 Allergy to other foods: Secondary | ICD-10-CM

## 2016-09-06 DIAGNOSIS — H1010 Acute atopic conjunctivitis, unspecified eye: Secondary | ICD-10-CM

## 2016-09-06 DIAGNOSIS — J4520 Mild intermittent asthma, uncomplicated: Secondary | ICD-10-CM | POA: Diagnosis not present

## 2016-09-06 DIAGNOSIS — N939 Abnormal uterine and vaginal bleeding, unspecified: Secondary | ICD-10-CM | POA: Insufficient documentation

## 2016-09-06 DIAGNOSIS — N946 Dysmenorrhea, unspecified: Secondary | ICD-10-CM | POA: Insufficient documentation

## 2016-09-06 DIAGNOSIS — E221 Hyperprolactinemia: Secondary | ICD-10-CM | POA: Insufficient documentation

## 2016-09-06 MED ORDER — OLOPATADINE HCL 0.2 % OP SOLN: 1.0000 [drp] | Freq: Every day | OPHTHALMIC | 5 refills | Status: DC | PRN

## 2016-09-06 MED ORDER — MONTELUKAST SODIUM 10 MG PO TABS: 10.0000 mg | ORAL_TABLET | Freq: Every day | ORAL | 5 refills | Status: DC

## 2016-09-06 MED ORDER — EPINEPHRINE 0.3 MG/0.3ML IJ SOAJ: 0.3000 mg | Freq: Once | INTRAMUSCULAR | 1 refills | Status: AC

## 2016-09-06 NOTE — Patient Instructions (Addendum)
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   Allergen avoidance measures discussed and handouts provided  Continue Nasonex 1-2 sprays each nostril daily and drainage  Continue Zyrtec 10 mg daily for allergy symptoms. Recommend taking prior to any known pet exposure.  Start Singulair 10 mg daily  Use Pataday 1 drop each eye as needed for itchy, watery, red eyes  Allergen immunotherapy for allergy shots benefits, risk and protocol discussed today.  Consent signed.   Will prescribe epinephrine device to bring with you on days of your injections.  Please make first shot appointment for 3-4 weeks out from today's visit.    Intermittent asthma Continue as needed use of albuterol inhaler  2 puffs or nebulizer 1 vial every 4-6 hours for cough, wheeze, difficulty breathing or chest tightness. Monitor frequency of use.    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   Would continue avoidance at this time   follow-up in 4-6 months or sooner if needed

## 2016-09-06 NOTE — Progress Notes (Signed)
New Patient Note  RE: Lori Carson MRN: [id number] DOB: 01/09/1991 Date of Office Visit: 09/06/2016  Referring provider: Marda Stalker, PA-C Primary care provider: Marda Stalker, PA-C  Chief Complaint: Allergies  History of present illness: Lori Carson is a 26 y.o. female presenting today for consultation for allergies.   She has a history of intermittent asthma.  She reports she had an asthma exacerbation in December 2017.  She was seen at her PCP who treated her with prednisone taper as well as advised she use her inhaler as needed.  She states her asthma symptoms are usually intermittent but lately she is having to use her albuterol at night for a SOB sensation however she is not sure if the SOB is related to her asthma or if she is panicking.  She does take albuterol prior to activity.  Triggers are cold and pet exposure.  She has been on Dulera in the past when she use to live in [place] but has not used a controller inhaler for years.   She recalls dog sitting having worsening of her breathing with wheezing as well as increased allergy symptoms with runny nose and itchy watery eyes.  She reports she was quite miserable with the dog exposure and she will like to be able to tolerate being around dogs as she would like to get one herself.  She states she has a friend who has a dog that went on allergen immunotherapy and is able to be around her dog without any symptoms now. That she is interested in allergen immunotherapy. Spring time is worse for her with itchy, puffy eyes, runny nose.  She takes nasonex 1 spray each nostril as needed and she has Zyrtec as needed as well.    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   She reports green peas or chick peas her throat will tickle that she tries to avoid this food. She used to have an EpiPen years ago with her shellfish allergy but no longer has access.  She takes omeprazole for  reflux that she also reports having a hiatal hernia      Review of systems: Review of Systems  Constitutional: Negative for chills, fever and malaise/fatigue.  HENT: Positive for congestion and nosebleeds. Negative for ear discharge, sinus pain, sore throat and tinnitus.   Eyes: Negative for discharge and redness.  Respiratory: Positive for shortness of breath.   Cardiovascular: Negative for chest pain.  Gastrointestinal: Negative for abdominal pain, heartburn, nausea and vomiting.  Musculoskeletal: Negative for joint pain and myalgias.  Skin: Negative for itching and rash.  Neurological: Negative for headaches.    All other systems negative unless noted above in HPI  Past medical history: Past Medical History:  Diagnosis Date  . Anemia    borderline  . Asthma   . Chest pain    left arm numbness but can take tums and goes away  . Fibroids   . GERD (gastroesophageal reflux disease)     Past surgical history: Past Surgical History:  Procedure Laterality Date  . MYOMECTOMY N/A 07/08/2016   Procedure: MYOMECTOMY with peritoneal biopsy;  Surgeon: Everett Graff, MD;  Location: [facility];  Service: Gynecology;  Laterality: N/A;  . OVARIAN CYST REMOVAL N/A 07/08/2016   Procedure: OVARIAN CYSTECTOMY right, left paratubal cystectomy;  Surgeon: Everett Graff, MD;  Location: [facility];  Service: Gynecology;  Laterality: N/A;  . TONSILLECTOMY    . UPPER GI  ENDOSCOPY      Family history:  Family History  Problem Relation Age of Onset  . Asthma Mother   . Asthma Father   . Asthma Brother   . Asthma Paternal Grandfather   . Allergic rhinitis Neg Hx   . Angioedema Neg Hx   . Eczema Neg Hx   . Immunodeficiency Neg Hx   . Urticaria Neg Hx     Social history: She lives in an apartment with carpeting with electric heating and central cooling. There are no pets in the home but there are dogs outside of the home. There is no concern for water damage, mildew or roaches in the home. She  works as a Psychologist, sport and exercise. She denies a smoking history.   Medication List: Allergies as of 09/06/2016      Reactions   Oxycodone Itching      Medication List       Accurate as of 09/06/16  4:08 PM. Always use your most recent med list.          albuterol 108 (90 Base) MCG/ACT inhaler Commonly known as:  PROVENTIL HFA;VENTOLIN HFA Inhale 2 puffs into the lungs every 4 (four) hours as needed for wheezing or shortness of breath.   EPINEPHrine 0.3 mg/0.3 mL Soaj injection Commonly known as:  AUVI-Q Inject 0.3 mLs (0.3 mg total) into the muscle once.   ibuprofen 600 MG tablet Commonly known as:  ADVIL,MOTRIN 1  po pc every 6 hours for 5 days then prn-pain   mometasone 50 MCG/ACT nasal spray Commonly known as:  NASONEX Place 2 sprays into the nose daily as needed (congestion).   montelukast 10 MG tablet Commonly known as:  SINGULAIR Take 1 tablet (10 mg total) by mouth at bedtime.   MULTIVITAMIN ADULT EXTRA C PO multivitamin   naproxen 500 MG tablet Commonly known as:  NAPROSYN naproxen 500 mg tablet  Take 1 tablet twice a day by oral route.   Olopatadine HCl 0.2 % Soln Commonly known as:  PATADAY Place 1 drop into both eyes daily as needed.   omeprazole 20 MG capsule Commonly known as:  PRILOSEC Take 20 mg by mouth daily as needed (heartburn).       Known medication allergies: Allergies  Allergen Reactions  . Oxycodone Itching     Physical examination: Blood pressure 112/76, pulse 63, temperature 98.5 F (36.9 C), temperature source Oral, resp. rate 16, height 5\' 7"  (1.702 m), weight 180 lb 9.6 oz (81.9 kg), SpO2 96 %.  General: Alert, interactive, in no acute distress. HEENT: TMs pearly gray, turbinates moderately edematous with clear discharge, post-pharynx non erythematous. Neck: Supple without lymphadenopathy. Lungs: Clear to auscultation without wheezing, rhonchi or rales. {no increased work of breathing. CV: Normal S1, S2 without  murmurs. Abdomen: Nondistended, nontender. Skin: Warm and dry, without lesions or rashes. Extremities:  No clubbing, cyanosis or edema. Neuro:   Grossly intact.  Diagnositics/Labs:  Spirometry: FEV1: 3.31L  116%, FVC: 4.00L  120%, ratio consistent with nonobstructive pattern  Allergy testing: environmental skin prick testing is positive for grasses, trees, weeds, molds, cat, dog, dust mite, horse, cockroach, mouse Skin prick testing to green pea is slightly reactive  Allergy testing results were read and interpreted by provider, documented by clinical staff.   Assessment and plan:   Allergic rhinoconjunctivitis  - Allergy testing today is positive for grasses, trees, weeds, molds, dust mites, cat, dog, horse, cockroach, mouse.   Allergen avoidance measures discussed and handouts provided  - Continue Nasonex  1-2 sprays each nostril daily and drainage  - Continue Zyrtec 10 mg daily for allergy symptoms. Recommend taking prior to any known pet exposure.  - Start Singulair 10 mg daily  - Use Pataday 1 drop each eye as needed for itchy, watery, red eyes  - Allergen immunotherapy for allergy shots benefits, risk and protocol discussed today.  Consent signed.   Will prescribe epinephrine device to bring with you on days of your injections.  Please make first shot appointment for 3-4 weeks out from today's visit.   Intermittent asthma - Continue as needed use of albuterol inhaler  2 puffs or nebulizer 1 vial every 4-6 hours for cough, wheeze, difficulty breathing or chest tightness. Monitor frequency of use.    - Start Singulair as above  Food allergy/pollen oral food syndrome - Food testing for green pea is slightly positive.   Would continue avoidance at this time   follow-up in 4-6 months or sooner if needed   I appreciate the opportunity to take part in Sah[place]'s care. Please do not hesitate to contact me with questions.  Sincerely,   Prudy Feeler,  MD Allergy/Immunology [facility]

## 2016-09-16 ENCOUNTER — Telehealth: Payer: Self-pay | Admitting: Allergy

## 2016-09-16 NOTE — Telephone Encounter (Signed)
Pt called and did not pick up her meds the singulair and epi-pen and the pharmacy put them back and she needs to have [place] recall it back into [facility] [phone].

## 2016-09-17 ENCOUNTER — Other Ambulatory Visit: Payer: Self-pay

## 2016-09-17 DIAGNOSIS — J309 Allergic rhinitis, unspecified: Secondary | ICD-10-CM

## 2016-09-17 DIAGNOSIS — J4520 Mild intermittent asthma, uncomplicated: Secondary | ICD-10-CM

## 2016-09-17 DIAGNOSIS — H1010 Acute atopic conjunctivitis, unspecified eye: Secondary | ICD-10-CM

## 2016-09-17 MED ORDER — EPINEPHRINE 0.3 MG/0.3ML IJ SOAJ: INTRAMUSCULAR | 1 refills | Status: DC

## 2016-09-17 MED ORDER — EPINEPHRINE 0.3 MG/0.3ML IJ SOAJ: 0.3000 mg | Freq: Once | INTRAMUSCULAR | 3 refills | Status: DC

## 2016-09-17 MED ORDER — MONTELUKAST SODIUM 10 MG PO TABS: 10.0000 mg | ORAL_TABLET | Freq: Every day | ORAL | 5 refills | Status: DC

## 2016-11-08 NOTE — Addendum Note (Signed)
Addendum  created 11/08/16 1451 by Lyndle Herrlich, MD   Sign clinical note

## 2016-11-08 NOTE — Anesthesia Postprocedure Evaluation (Signed)
Anesthesia Post Note  Patient: Lori Carson  Procedure(s) Performed: Procedure(s) (LRB): MYOMECTOMY with peritoneal biopsy (N/A) OVARIAN CYSTECTOMY right, left paratubal cystectomy (N/A)     Anesthesia Post Evaluation  Last Vitals:  Vitals:   07/10/16 1100 07/10/16 1200  BP: 137/79 121/70  Pulse: 97 77  Resp: 18 18  Temp: 36.6 C 37 C    Last Pain:  Vitals:   07/10/16 1200  TempSrc: Oral  PainSc:                  Riccardo Dubin

## 2017-04-15 ENCOUNTER — Encounter (HOSPITAL_COMMUNITY): Payer: Self-pay | Admitting: Family Medicine

## 2017-04-15 ENCOUNTER — Ambulatory Visit (HOSPITAL_COMMUNITY)
Admission: EM | Admit: 2017-04-15 | Discharge: 2017-04-15 | Disposition: A | Discharge status: Home / Self Care | Payer: PRIVATE HEALTH INSURANCE | Attending: Family Medicine | Admitting: Family Medicine

## 2017-04-15 DIAGNOSIS — R079 Chest pain, unspecified: Secondary | ICD-10-CM

## 2017-04-15 DIAGNOSIS — M5489 Other dorsalgia: Secondary | ICD-10-CM

## 2017-04-15 MED ORDER — GI COCKTAIL ~~LOC~~
30.0000 mL | Freq: Once | ORAL | Status: AC
  Administered 2017-04-15: 30 mL via ORAL

## 2017-04-15 MED ORDER — GI COCKTAIL ~~LOC~~
ORAL | Status: AC
  Filled 2017-04-15: qty 30

## 2017-04-15 NOTE — Discharge Instructions (Signed)
Please take Omeprazole daily instead of as needed or before eating trigger food.  Your chest pain does not appear to be cardiac in origin.   Please follow up with GI and your Primary Care Doctor.  Please go to ED if experiencing worsening of chest pain, increased shortness of breath, sweating, increased dizziness.

## 2017-04-15 NOTE — ED Provider Notes (Signed)
[facility]    CSN: [id number] Arrival date & time: 04/15/17  1714     History   Chief Complaint Chief Complaint  Patient presents with  . Abdominal Pain    HPI Lori Carson is a 26 y.o. female presenting with chest pain. States pain is burning/sharp sensation. History of reflux with hiatal hernia, takes omeprazole PRN or before she knows she is going to eat trigger foods. Also uses Tums as needed, this usually relieves sensation. Today sensation is worse than normal radiating to back and with dizziness, mild tingling sensation into right hand. Dizziness described as light headed sensation. No abdominal pain, nausea, vomiting, diarrhea. No shortness of breath. Endorses occasional difficulty swallowing with sensation of things slow to go down throat. Had Endoscopy 4-5 years ago.   Non smoker, denies drug/cocine use, nno personal history of diabetes or hypertension. Family history of diabetes in both paternal and maternal grandparents. Grandfather had CAD with CABG, died at age 36. No known heart disease in parents.   HPI  Past Medical History:  Diagnosis Date  . Anemia    borderline  . Asthma   . Chest pain    left arm numbness but can take tums and goes away  . Fibroids   . GERD (gastroesophageal reflux disease)     Patient Active Problem List   Diagnosis Date Noted  . Abnormal uterine bleeding 09/06/2016  . Dysmenorrhea 09/06/2016  . Hyperprolactinemia ([place]) 09/06/2016  . Fibroid, uterine 07/08/2016  . Endometriosis 07/08/2016  . Gastroesophageal reflux disease 07/04/2016  . Hiatal hernia 07/04/2016    Past Surgical History:  Procedure Laterality Date  . MYOMECTOMY N/A 07/08/2016   Procedure: MYOMECTOMY with peritoneal biopsy;  Surgeon: Everett Graff, MD;  Location: [facility];  Service: Gynecology;  Laterality: N/A;  . OVARIAN CYST REMOVAL N/A 07/08/2016   Procedure: OVARIAN CYSTECTOMY right, left paratubal cystectomy;  Surgeon: Everett Graff, MD;   Location: [facility];  Service: Gynecology;  Laterality: N/A;  . TONSILLECTOMY    . UPPER GI ENDOSCOPY      OB History    No data available       Home Medications    Prior to Admission medications   Medication Sig Start Date End Date Taking? Authorizing Provider  albuterol (PROVENTIL HFA;VENTOLIN HFA) 108 (90 Base) MCG/ACT inhaler Inhale 2 puffs into the lungs every 4 (four) hours as needed for wheezing or shortness of breath.    [provider]  EPINEPHrine (AUVI-Q) 0.3 mg/0.3 mL IJ SOAJ injection Use as directed for severe allergic reaction 09/17/16   Bobbitt, [place] Muta, MD  ibuprofen (ADVIL,MOTRIN) 600 MG tablet 1  po pc every 6 hours for 5 days then prn-pain 07/10/16   Earnstine Regal, PA-C  mometasone (NASONEX) 50 MCG/ACT nasal spray Place 2 sprays into the nose daily as needed (congestion).    [provider]  montelukast (SINGULAIR) 10 MG tablet Take 1 tablet (10 mg total) by mouth at bedtime. 09/17/16   Bobbitt, [place] Muta, MD  Multiple Vitamins-Minerals (MULTIVITAMIN ADULT EXTRA C PO) multivitamin    [provider]  naproxen (NAPROSYN) 500 MG tablet naproxen 500 mg tablet  Take 1 tablet twice a day by oral route.    [provider]  Olopatadine HCl (PATADAY) 0.2 % SOLN Place 1 drop into both eyes daily as needed. 09/06/16   Kennith Gain, MD  omeprazole (PRILOSEC) 20 MG capsule Take 20 mg by mouth daily as needed (heartburn).    [provider]  Family History Family History  Problem Relation Age of Onset  . Asthma Mother   . Asthma Father   . Asthma Brother   . Asthma Paternal Grandfather   . Allergic rhinitis Neg Hx   . Angioedema Neg Hx   . Eczema Neg Hx   . Immunodeficiency Neg Hx   . Urticaria Neg Hx     Social History Social History   Tobacco Use  . Smoking status: Never Smoker  . Smokeless tobacco: Never Used  Substance Use Topics  . Alcohol use: Yes  . Drug use: No     Allergies    Oxycodone   Review of Systems Review of Systems  Constitutional: Negative for chills and fever.  HENT: Positive for trouble swallowing. Negative for ear pain and sore throat.   Eyes: Negative for pain and visual disturbance.  Respiratory: Negative for cough and shortness of breath.   Cardiovascular: Positive for chest pain. Negative for palpitations.  Gastrointestinal: Negative for abdominal pain, diarrhea, nausea and vomiting.  Musculoskeletal: Positive for back pain. Negative for arthralgias.  Skin: Negative for color change and rash.  Neurological: Positive for dizziness and light-headedness. Negative for syncope and weakness.  All other systems reviewed and are negative.    Physical Exam Triage Vital Signs ED Triage Vitals  Enc Vitals Group     BP 04/15/17 1741 130/77     Pulse Rate 04/15/17 1741 (!) 58     Resp 04/15/17 1741 18     Temp 04/15/17 1741 98.4 F (36.9 C)     Temp src --      SpO2 04/15/17 1741 100 %     Weight --      Height --      Head Circumference --      Peak Flow --      Pain Score 04/15/17 1742 3     Pain Loc --      Pain Edu? --      Excl. in [place]? --    No data found.  Updated Vital Signs BP 130/77   Pulse (!) 58   Temp 98.4 F (36.9 C)   Resp 18   LMP 04/02/2017   SpO2 100%    Physical Exam  Constitutional: She is oriented to person, place, and time. She appears well-developed and well-nourished. No distress.  HENT:  Head: Normocephalic and atraumatic.  Mouth/Throat: Oropharynx is clear and moist.  Eyes: Conjunctivae and EOM are normal. Pupils are equal, round, and reactive to light.  Neck: Neck supple.  Cardiovascular: Normal rate and regular rhythm.  No murmur heard. Pain not reproducible on exam  Pulmonary/Chest: Effort normal and breath sounds normal. No respiratory distress. She has no wheezes. She exhibits no tenderness.  Abdominal: Soft. Bowel sounds are normal. There is no tenderness.  Musculoskeletal: She exhibits no  edema.  Neurological: She is alert and oriented to person, place, and time.  Skin: Skin is warm and dry.  Psychiatric: She has a normal mood and affect.  Nursing note and vitals reviewed.    UC Treatments / Results  Labs (all labs ordered are listed, but only abnormal results are displayed) Labs Reviewed - No data to display  EKG  EKG Interpretation None     EKG with nonspecific ST elevation, likely early repolarization. Given negative risk factors, unlikely cardiac related.   Radiology No results found.  Procedures Procedures (including critical care time)  Medications Ordered in UC Medications  gi cocktail (Maalox,Lidocaine,Donnatal) (not administered)  Initial Impression / Assessment and Plan / UC Course  I have reviewed the triage vital signs and the nursing notes.  Pertinent labs & imaging results that were available during my care of the patient were reviewed by me and considered in my medical decision making (see chart for details).    EKG obtained. GI cocktail given. Pain moderately relieved with cocktail. Unlikely cardiac related with negative risk factors. Omeprazole daily vs as needed/prophylactic. Follow up with Gastroenterology for follow up of reflux control and discuss need for another endoscopy. Follow up with PCP for heart risk evaluation.  Discussed return precautions. Patient verbalized understanding and is agreeable with plan.   Final Clinical Impressions(s) / UC Diagnoses   Final diagnoses:  None    ED Discharge Orders    None       Controlled Substance Prescriptions [place] Controlled Substance Registry consulted? Not Applicable   Janith Lima, [place] 04/15/17 1859

## 2017-04-15 NOTE — ED Triage Notes (Signed)
Pt here for epigastric pain with burning over the past week. Reports that she has been taking tums and omeprazole with some relief but today she became dizzy and pain in upper back area.

## 2018-07-14 ENCOUNTER — Encounter (HOSPITAL_COMMUNITY): Payer: Self-pay | Admitting: Emergency Medicine

## 2018-07-14 ENCOUNTER — Ambulatory Visit (HOSPITAL_COMMUNITY)
Admission: EM | Admit: 2018-07-14 | Discharge: 2018-07-14 | Disposition: A | Discharge status: Home / Self Care | Payer: PRIVATE HEALTH INSURANCE | Attending: Internal Medicine | Admitting: Internal Medicine

## 2018-07-14 ENCOUNTER — Other Ambulatory Visit: Payer: Self-pay

## 2018-07-14 DIAGNOSIS — R05 Cough: Secondary | ICD-10-CM | POA: Diagnosis not present

## 2018-07-14 DIAGNOSIS — R0981 Nasal congestion: Secondary | ICD-10-CM | POA: Insufficient documentation

## 2018-07-14 DIAGNOSIS — Z20828 Contact with and (suspected) exposure to other viral communicable diseases: Secondary | ICD-10-CM | POA: Insufficient documentation

## 2018-07-14 DIAGNOSIS — J029 Acute pharyngitis, unspecified: Secondary | ICD-10-CM | POA: Insufficient documentation

## 2018-07-14 DIAGNOSIS — Z79899 Other long term (current) drug therapy: Secondary | ICD-10-CM | POA: Diagnosis not present

## 2018-07-14 DIAGNOSIS — K219 Gastro-esophageal reflux disease without esophagitis: Secondary | ICD-10-CM | POA: Insufficient documentation

## 2018-07-14 DIAGNOSIS — R059 Cough, unspecified: Secondary | ICD-10-CM

## 2018-07-14 DIAGNOSIS — J45909 Unspecified asthma, uncomplicated: Secondary | ICD-10-CM | POA: Insufficient documentation

## 2018-07-14 LAB — POCT INFLUENZA A/B
Influenza A, POC: NEGATIVE
Influenza B, POC: NEGATIVE

## 2018-07-14 LAB — POCT RAPID STREP A: STREPTOCOCCUS, GROUP A SCREEN (DIRECT): NEGATIVE

## 2018-07-14 NOTE — Discharge Instructions (Addendum)
You must socially distance yourself-- preferably in the isolation of your home--until COVID-19 infection ruled out

## 2018-07-14 NOTE — ED Provider Notes (Signed)
[facility]    CSN: [id number] Arrival date & time: 07/14/18  1159     History   Chief Complaint Chief Complaint  Patient presents with  . Nasal Congestion    HPI Lori Carson is a 28 y.o. female.   Patient with past medical history of GERD as well as endometriosis presents to urgent care complaining of cough, body aches and sore throat.  The latter started 5 days prior to a trip to [place] this weekend.  She took Augmentin x3 days and a short course of prednisone but did not complete the course of antibiotics.  Her throat felt better before traveling to [place].  The patient admits to being in a mall that was linked to 2 positive cases of novel coronavirus.  She was also at the beach with many other people.  2 of her friends that went on the trip now have similar symptoms.  Her roommate who also went on the trip is symptom-free.  She links her sore throat to kissing a new partner.  He is not sick.  She admits to having bad tasting drainage in her posterior pharynx.  She thinks she had a fever 1 day prior to her trip but admits to subjective fevers since returning from her trip.  Past medical history also significant for asthma but the patient only feels short of breath after a paroxysmal cough.  She takes Nasacort, Claritin and Singulair daily.  She has not had to use her rescue inhaler.     Past Medical History:  Diagnosis Date  . Anemia    borderline  . Asthma   . Chest pain    left arm numbness but can take tums and goes away  . Fibroids   . GERD (gastroesophageal reflux disease)     Patient Active Problem List   Diagnosis Date Noted  . Abnormal uterine bleeding 09/06/2016  . Dysmenorrhea 09/06/2016  . Hyperprolactinemia ([place]) 09/06/2016  . Fibroid, uterine 07/08/2016  . Endometriosis 07/08/2016  . Gastroesophageal reflux disease 07/04/2016  . Hiatal hernia 07/04/2016    Past Surgical History:  Procedure Laterality Date  . MYOMECTOMY N/A 07/08/2016   Procedure: MYOMECTOMY with peritoneal biopsy;  Surgeon: Everett Graff, MD;  Location: [facility];  Service: Gynecology;  Laterality: N/A;  . OVARIAN CYST REMOVAL N/A 07/08/2016   Procedure: OVARIAN CYSTECTOMY right, left paratubal cystectomy;  Surgeon: Everett Graff, MD;  Location: [facility];  Service: Gynecology;  Laterality: N/A;  . TONSILLECTOMY    . UPPER GI ENDOSCOPY      OB History   No obstetric history on file.      Home Medications    Prior to Admission medications   Medication Sig Start Date End Date Taking? Authorizing Provider  albuterol (PROVENTIL HFA;VENTOLIN HFA) 108 (90 Base) MCG/ACT inhaler Inhale 2 puffs into the lungs every 4 (four) hours as needed for wheezing or shortness of breath.    [provider]  EPINEPHrine (AUVI-Q) 0.3 mg/0.3 mL IJ SOAJ injection Use as directed for severe allergic reaction 09/17/16   Bobbitt, Sedalia Muta, MD  ibuprofen (ADVIL,MOTRIN) 600 MG tablet 1  po pc every 6 hours for 5 days then prn-pain 07/10/16   Earnstine Regal, PA-C  mometasone (NASONEX) 50 MCG/ACT nasal spray Place 2 sprays into the nose daily as needed (congestion).    [provider]  montelukast (SINGULAIR) 10 MG tablet Take 1 tablet (10 mg total) by mouth at bedtime. 09/17/16   Bobbitt, Sedalia Muta, MD  Multiple Vitamins-Minerals (  MULTIVITAMIN ADULT EXTRA C PO) multivitamin    [provider]  naproxen (NAPROSYN) 500 MG tablet naproxen 500 mg tablet  Take 1 tablet twice a day by oral route.    [provider]  Olopatadine HCl (PATADAY) 0.2 % SOLN Place 1 drop into both eyes daily as needed. 09/06/16   Kennith Gain, MD  omeprazole (PRILOSEC) 20 MG capsule Take 20 mg by mouth daily as needed (heartburn).    [provider]    Family History Family History  Problem Relation Age of Onset  . Asthma Mother   . Asthma Father   . Asthma Brother   . Asthma Paternal Grandfather   . Allergic rhinitis Neg Hx   . Angioedema Neg Hx     . Eczema Neg Hx   . Immunodeficiency Neg Hx   . Urticaria Neg Hx     Social History Social History   Tobacco Use  . Smoking status: Never Smoker  . Smokeless tobacco: Never Used  Substance Use Topics  . Alcohol use: Yes  . Drug use: No     Allergies   Oxycodone   Review of Systems Review of Systems  Constitutional: Negative for chills and fever.  HENT: Positive for sore throat. Negative for tinnitus.   Eyes: Negative for redness.  Respiratory: Negative for cough and shortness of breath.   Cardiovascular: Negative for chest pain and palpitations.  Gastrointestinal: Negative for abdominal pain, diarrhea, nausea and vomiting.  Genitourinary: Negative for dysuria, frequency and urgency.  Musculoskeletal: Negative for myalgias.  Skin: Negative for rash.       No lesions  Neurological: Negative for weakness.  Hematological: Does not bruise/bleed easily.  Psychiatric/Behavioral: Negative for suicidal ideas.     Physical Exam Triage Vital Signs ED Triage Vitals  Enc Vitals Group     BP 07/14/18 1245 (!) 144/93     Pulse Rate 07/14/18 1245 73     Resp 07/14/18 1245 18     Temp 07/14/18 1245 98.3 F (36.8 C)     Temp Source 07/14/18 1245 Temporal     SpO2 07/14/18 1245 100 %     Weight --      Height --      Head Circumference --      Peak Flow --      Pain Score 07/14/18 1246 4     Pain Loc --      Pain Edu? --      Excl. in [place]? --    No data found.  Updated Vital Signs BP (!) 144/93 (BP Location: Right Arm)   Pulse 73   Temp 98.3 F (36.8 C) (Temporal)   Resp 18   SpO2 100%   Visual Acuity Right Eye Distance:   Left Eye Distance:   Bilateral Distance:    Right Eye Near:   Left Eye Near:    Bilateral Near:     Physical Exam Vitals signs and nursing note reviewed.  Constitutional:      General: She is not in acute distress.    Appearance: She is well-developed.  HENT:     Head: Normocephalic and atraumatic.  Eyes:     General: No scleral  icterus.    Conjunctiva/sclera: Conjunctivae normal.     Pupils: Pupils are equal, round, and reactive to light.  Neck:     Musculoskeletal: Normal range of motion and neck supple.     Thyroid: No thyromegaly.     Vascular: No JVD.  Trachea: No tracheal deviation.  Cardiovascular:     Rate and Rhythm: Normal rate and regular rhythm.     Heart sounds: Normal heart sounds. No murmur. No friction rub. No gallop.   Pulmonary:     Effort: Pulmonary effort is normal.     Breath sounds: Normal breath sounds.  Abdominal:     General: Bowel sounds are normal. There is no distension.     Palpations: Abdomen is soft.     Tenderness: There is no abdominal tenderness.  Musculoskeletal: Normal range of motion.  Lymphadenopathy:     Cervical: No cervical adenopathy.  Skin:    General: Skin is warm and dry.  Neurological:     Mental Status: She is alert and oriented to person, place, and time.     Cranial Nerves: No cranial nerve deficit.  Psychiatric:        Behavior: Behavior normal.        Thought Content: Thought content normal.        Judgment: Judgment normal.      UC Treatments / Results  Labs (all labs ordered are listed, but only abnormal results are displayed) Labs Reviewed - No data to display  EKG None  Radiology No results found.  Procedures Procedures (including critical care time)  Medications Ordered in UC Medications - No data to display  Initial Impression / Assessment and Plan / UC Course  I have reviewed the triage vital signs and the nursing notes.  Pertinent labs & imaging results that were available during my care of the patient were reviewed by me and considered in my medical decision making (see chart for details).     Strep negative.  Sent for strep a culture.  Influenza A and B negative.  Patient on home isolation until COVID-19 assay clear.   Final Clinical Impressions(s) / UC Diagnoses   Final diagnoses:  None   Discharge Instructions     None    ED Prescriptions    None     Controlled Substance Prescriptions [place] Controlled Substance Registry consulted? Not Applicable   Harrie Foreman, MD 07/14/18 [phone]

## 2018-07-14 NOTE — ED Triage Notes (Signed)
Pt here for URI sx; pt recently returned from [place]; pt sts fever of 99.8 prior to leaving

## 2018-07-16 LAB — CULTURE, GROUP A STREP (THRC)

## 2018-07-17 ENCOUNTER — Telehealth (HOSPITAL_COMMUNITY): Payer: Self-pay | Admitting: Emergency Medicine

## 2018-07-17 LAB — NOVEL CORONAVIRUS, NAA (HOSP ORDER, SEND-OUT TO REF LAB; TAT 18-24 HRS): SARS-CoV-2, NAA: NOT DETECTED

## 2018-07-17 NOTE — Telephone Encounter (Signed)
Patient contacted and made aware of all results, all questions answered.   

## 2018-07-31 ENCOUNTER — Other Ambulatory Visit: Payer: Self-pay

## 2018-07-31 ENCOUNTER — Ambulatory Visit (INDEPENDENT_AMBULATORY_CARE_PROVIDER_SITE_OTHER): Payer: PRIVATE HEALTH INSURANCE | Admitting: Allergy

## 2018-07-31 ENCOUNTER — Encounter: Payer: Self-pay | Admitting: Allergy

## 2018-07-31 VITALS — BP 118/78 | HR 96 | Temp 98.2°F | Resp 16 | Ht 67.0 in | Wt 181.2 lb

## 2018-07-31 DIAGNOSIS — H1013 Acute atopic conjunctivitis, bilateral: Secondary | ICD-10-CM

## 2018-07-31 DIAGNOSIS — J4520 Mild intermittent asthma, uncomplicated: Secondary | ICD-10-CM

## 2018-07-31 DIAGNOSIS — Z91018 Allergy to other foods: Secondary | ICD-10-CM | POA: Diagnosis not present

## 2018-07-31 DIAGNOSIS — J3089 Other allergic rhinitis: Secondary | ICD-10-CM

## 2018-07-31 MED ORDER — MONTELUKAST SODIUM 10 MG PO TABS: 10.0000 mg | ORAL_TABLET | Freq: Every day | ORAL | 5 refills | Status: DC

## 2018-07-31 MED ORDER — ALBUTEROL SULFATE HFA 108 (90 BASE) MCG/ACT IN AERS: 2.0000 | INHALATION_SPRAY | RESPIRATORY_TRACT | 1 refills | Status: DC | PRN

## 2018-07-31 MED ORDER — AZELASTINE HCL 0.1 % NA SOLN: 2.0000 | Freq: Two times a day (BID) | NASAL | 5 refills | Status: DC

## 2018-07-31 NOTE — Progress Notes (Signed)
Follow-up Note  RE: Lori Carson MRN: [id number] DOB: 11/05/1990 Date of Office Visit: 07/31/2018   History of present illness: Lori Carson is a 28 y.o. female presenting today for follow-up of allergic rhinitis with conjunctivitis, asthma and oral allergy sydrome.  She was last seen in the office on 09/06/16 by myself.   She states she started to feel bad around February with body aches and chills and increased nasal drainage.  She did well on a trip to [place] and returned on March 13 and states that lot of the people on the trip are also feeling sick.  She got tested for mono, strep, flu as well as COVID-19 and all of these were negative.  She states that she continues to have a lot of thick nasal drainage that is draining on the back of her throat that has a a nasty taste to it.  She also states that she is been having some wheezing primarily at night where she is needing to use her albuterol for the past 2 weeks.  She is taking Singulair that she started back up over the past month.  She also has been taking Flonase and Claritin-D but does not feel that these are providing any relief. She also states that she had a course of prednisone as well after her trip.  She also still reports that she is feeling fatigued.  She has not tried performing nasal saline rinses at this time. She never initiated allergen immunotherapy after her last visit. She continues to avoid green peas.  Review of systems: Review of Systems  Constitutional: Positive for chills and malaise/fatigue.  HENT: Positive for congestion and sore throat. Negative for ear discharge, nosebleeds and sinus pain.   Eyes: Negative for pain, discharge and redness.  Respiratory: Positive for wheezing. Negative for cough and shortness of breath.   Cardiovascular: Negative for chest pain.  Gastrointestinal: Negative for abdominal pain, constipation, diarrhea, heartburn, nausea and vomiting.  Musculoskeletal: Negative for joint  pain.  Skin: Negative for itching and rash.  Neurological: Negative for headaches.    All other systems negative unless noted above in HPI  Past medical/social/surgical/family history have been reviewed and are unchanged unless specifically indicated below.  No changes  Medication List: Allergies as of 07/31/2018      Reactions   Oxycodone Itching      Medication List       Accurate as of July 31, 2018  4:20 PM. Always use your most recent med list.        albuterol 108 (90 Base) MCG/ACT inhaler Commonly known as:  PROVENTIL HFA;VENTOLIN HFA Inhale 2 puffs into the lungs every 4 (four) hours as needed for wheezing or shortness of breath.   azelastine 0.1 % nasal spray Commonly known as:  ASTELIN Place 2 sprays into both nostrils 2 (two) times daily.   EPINEPHrine 0.3 mg/0.3 mL Soaj injection Commonly known as:  Auvi-Q Use as directed for severe allergic reaction   ibuprofen 600 MG tablet Commonly known as:  ADVIL,MOTRIN 1  po pc every 6 hours for 5 days then prn-pain   mometasone 50 MCG/ACT nasal spray Commonly known as:  NASONEX Place 2 sprays into the nose daily as needed (congestion).   montelukast 10 MG tablet Commonly known as:  SINGULAIR Take 1 tablet (10 mg total) by mouth at bedtime.   MULTIVITAMIN ADULT EXTRA C PO multivitamin   naproxen 500 MG tablet Commonly known as:  NAPROSYN naproxen 500 mg tablet  Take  1 tablet twice a day by oral route.   Olopatadine HCl 0.2 % Soln Commonly known as:  Pataday Place 1 drop into both eyes daily as needed.   pantoprazole 40 MG tablet Commonly known as:  PROTONIX Take 40 mg by mouth 2 (two) times daily.       Known medication allergies: Allergies  Allergen Reactions  . Oxycodone Itching     Physical examination: Blood pressure 118/78, pulse 96, temperature 98.2 F (36.8 C), temperature source Tympanic, resp. rate 16, height 5\' 7"  (1.702 m), weight 181 lb 3.2 oz (82.2 kg), last menstrual period  07/17/2018, SpO2 98 %.  General: Alert, interactive, in no acute distress. HEENT: PERRLA, TMs pearly gray, turbinates moderately edematous with thick discharge, post-pharynx non erythematous. Neck: Supple without lymphadenopathy. Lungs: Clear to auscultation without wheezing, rhonchi or rales. {no increased work of breathing. CV: Normal S1, S2 without murmurs. Abdomen: Nondistended, nontender. Skin: Warm and dry, without lesions or rashes. Extremities:  No clubbing, cyanosis or edema. Neuro:   Grossly intact.  Diagnositics/Labs: None today  Assessment and plan:   Allergic rhinitis with conjunctivitis  - continue avoidance measures for grasses, trees, weeds, molds, dust mites, cat, dog, horse, cockroach, mouse.  - start nasal saline rinse daily.  Use distilled water or boil water and bring to room temp prior to use.  Use prior to using her medicated nasal sprays  - start Astelin 2 sprays each nostril twice a day to help with nasal drainage/post-nasal drip    - continue Flonase 2 sprays each nostril daily for nasal congestion  - change Claritin to Xyzal 5mg  daily   - continue Singulair 10 mg daily  - continue Pataday 1 drop each eye as needed for itchy, watery, red eyes  Intermittent asthma - continue as needed use of albuterol inhaler  2 puffs or nebulizer 1 vial every 4-6 hours for cough, wheeze, difficulty breathing or chest tightness. Monitor frequency of use.   - continue Singulair as above - take Asmanex 2 puffs twice a day for next 2-4 weeks or until sample runs out  Asthma control goals:   Full participation in all desired activities (may need albuterol before activity)  Albuterol use two time or less a week on average (not counting use with activity)  Cough interfering with sleep two time or less a month  Oral steroids no more than once a year  No hospitalizations  Food allergy/pollen oral food syndrome Food testing for green pea is slightly positive on previous  testing.   Would continue avoidance at this time    She has tested negative for COVID-19, influenza, strep and mono.  It is most likely that she has a typical of viral illness that she got from being exposed on her trip.  She also likely has a large contributor factor being the pollen exposure as well.  follow-up in 4-6 months or sooner if needed  I appreciate the opportunity to take part in Lori Carson's care. Please do not hesitate to contact me with questions.  Sincerely,   Prudy Feeler, MD Allergy/Immunology [facility]

## 2018-07-31 NOTE — Patient Instructions (Addendum)
Allergic rhinitis with conjunctivitis  - continue avoidance measures for grasses, trees, weeds, molds, dust mites, cat, dog, horse, cockroach, mouse.  - start nasal saline rinse daily.  Use distilled water or boil water and bring to room temp prior to use.  Use prior to using her medicated nasal sprays  - start Astelin 2 sprays each nostril twice a day to help with nasal drainage/post-nasal drip    - continue Flonase 2 sprays each nostril daily for nasal congestion  - change Claritin to Xyzal 5mg  daily   - continue Singulair 10 mg daily  - continue Pataday 1 drop each eye as needed for itchy, watery, red eyes  Intermittent asthma - continue as needed use of albuterol inhaler  2 puffs or nebulizer 1 vial every 4-6 hours for cough, wheeze, difficulty breathing or chest tightness. Monitor frequency of use.   - continue Singulair as above - take Asmanex 2 puffs twice a day for next 2-4 weeks or until sample runs out  Asthma control goals:   Full participation in all desired activities (may need albuterol before activity)  Albuterol use two time or less a week on average (not counting use with activity)  Cough interfering with sleep two time or less a month  Oral steroids no more than once a year  No hospitalizations  Food allergy/pollen oral food syndrome Food testing for green pea is slightly positive on previous testing.   Would continue avoidance at this time   follow-up in 4-6 months or sooner if needed

## 2018-08-20 ENCOUNTER — Telehealth: Payer: Self-pay | Admitting: Allergy

## 2018-08-20 MED ORDER — LEVOCETIRIZINE DIHYDROCHLORIDE 5 MG PO TABS: 5.0000 mg | ORAL_TABLET | Freq: Every evening | ORAL | 5 refills | Status: DC

## 2018-08-20 MED ORDER — MOMETASONE FUROATE 100 MCG/ACT IN AERO: 2.0000 | INHALATION_SPRAY | Freq: Two times a day (BID) | RESPIRATORY_TRACT | 5 refills | Status: DC

## 2018-08-20 NOTE — Telephone Encounter (Signed)
Yes send Rx for Xyzal and Asmanex 175mcg 2 puffs twice a day.

## 2018-08-20 NOTE — Telephone Encounter (Signed)
Patient was given samples of Xyzal and Asmanex at her last appointment to see if they worked. They did and she would now like prescriptions for them sent into [facility] on [address] in [place].

## 2018-08-20 NOTE — Telephone Encounter (Signed)
Prescriptions have been sent to requested pharmacy. Called patient and informed. Patient verbalized understanding.

## 2018-08-20 NOTE — Telephone Encounter (Signed)
Will send in Rx for xyzal, however according to Dr. Julaine Hua notes patient was to take Asmanex 2 puffs twice a day for next 2-4 weeks or until sample runs out. Will need to see if Dr. Nelva Bush wants her to continue to take it. Can I send Rx for the Asmanex, if so which strength? Please advise.

## 2018-11-06 ENCOUNTER — Encounter (HOSPITAL_COMMUNITY): Payer: Self-pay

## 2018-11-06 ENCOUNTER — Ambulatory Visit (HOSPITAL_COMMUNITY)
Admission: EM | Admit: 2018-11-06 | Discharge: 2018-11-06 | Disposition: A | Discharge status: Home / Self Care | Payer: PRIVATE HEALTH INSURANCE | Attending: Family Medicine | Admitting: Family Medicine

## 2018-11-06 DIAGNOSIS — H60332 Swimmer's ear, left ear: Secondary | ICD-10-CM | POA: Diagnosis not present

## 2018-11-06 MED ORDER — CIPRODEX 0.3-0.1 % OT SUSP: 4.0000 [drp] | Freq: Two times a day (BID) | OTIC | 0 refills | Status: AC

## 2018-11-06 NOTE — ED Provider Notes (Signed)
[facility]    CSN: [id number] Arrival date & time: 11/06/18  1129      History   Chief Complaint Chief Complaint  Patient presents with  . Otalgia    HPI Lori Carson is a 28 y.o. female.   Lori Carson presents with complaints of left ear pain. Started out itching two days ago so used a qtip to clean it. Yesterday noted the pain. This morning increased pain, decreased hearing. Pain is sharp. Radiates to neck and head. No known drainage from the ear. Denies any previous similar. No fevers. No other URI symptoms. Had some expired antibiotic drops at home which she used last night and this morning, hasn't helped. Applying ice has helped. Hx of gerd, anemia, asthma,     ROS per HPI, negative if not otherwise mentioned.      Past Medical History:  Diagnosis Date  . Anemia    borderline  . Asthma   . Chest pain    left arm numbness but can take tums and goes away  . Fibroids   . GERD (gastroesophageal reflux disease)     Patient Active Problem List   Diagnosis Date Noted  . Abnormal uterine bleeding 09/06/2016  . Dysmenorrhea 09/06/2016  . Hyperprolactinemia ([place]) 09/06/2016  . Fibroid, uterine 07/08/2016  . Endometriosis 07/08/2016  . Gastroesophageal reflux disease 07/04/2016  . Hiatal hernia 07/04/2016    Past Surgical History:  Procedure Laterality Date  . MYOMECTOMY N/A 07/08/2016   Procedure: MYOMECTOMY with peritoneal biopsy;  Surgeon: Everett Graff, MD;  Location: [facility];  Service: Gynecology;  Laterality: N/A;  . OVARIAN CYST REMOVAL N/A 07/08/2016   Procedure: OVARIAN CYSTECTOMY right, left paratubal cystectomy;  Surgeon: Everett Graff, MD;  Location: [facility];  Service: Gynecology;  Laterality: N/A;  . TONSILLECTOMY    . UPPER GI ENDOSCOPY      OB History   No obstetric history on file.      Home Medications    Prior to Admission medications   Medication Sig Start Date End Date Taking? Authorizing Provider  albuterol  (PROVENTIL HFA;VENTOLIN HFA) 108 (90 Base) MCG/ACT inhaler Inhale 2 puffs into the lungs every 4 (four) hours as needed for wheezing or shortness of breath. 07/31/18   Kennith Gain, MD  azelastine (ASTELIN) 0.1 % nasal spray Place 2 sprays into both nostrils 2 (two) times daily. 07/31/18   Kennith Gain, MD  ciprofloxacin-dexamethasone (CIPRODEX) OTIC suspension Place 4 drops into the left ear 2 (two) times daily for 7 days. 11/06/18 11/13/18  Zigmund Gottron, NP  EPINEPHrine (AUVI-Q) 0.3 mg/0.3 mL IJ SOAJ injection Use as directed for severe allergic reaction 09/17/16   Bobbitt, Sedalia Muta, MD  ibuprofen (ADVIL,MOTRIN) 600 MG tablet 1  po pc every 6 hours for 5 days then prn-pain 07/10/16   Earnstine Regal, PA-C  levocetirizine (XYZAL) 5 MG tablet Take 1 tablet (5 mg total) by mouth every evening. 08/20/18   Padgett, Rae Halsted, MD  mometasone (NASONEX) 50 MCG/ACT nasal spray Place 2 sprays into the nose daily as needed (congestion).    [provider]  Mometasone Furoate [facility] HFA) 100 MCG/ACT AERO Inhale 2 puffs into the lungs 2 (two) times a day. 08/20/18   Kennith Gain, MD  montelukast (SINGULAIR) 10 MG tablet Take 1 tablet (10 mg total) by mouth at bedtime. 07/31/18   Kennith Gain, MD  Multiple Vitamins-Minerals (MULTIVITAMIN ADULT EXTRA C PO) multivitamin    [provider]  naproxen (  NAPROSYN) 500 MG tablet naproxen 500 mg tablet  Take 1 tablet twice a day by oral route.    [provider]  Olopatadine HCl (PATADAY) 0.2 % SOLN Place 1 drop into both eyes daily as needed. 09/06/16   Kennith Gain, MD  pantoprazole (PROTONIX) 40 MG tablet Take 40 mg by mouth 2 (two) times daily.    [provider]    Family History Family History  Problem Relation Age of Onset  . Asthma Mother   . Asthma Father   . Asthma Brother   . Asthma Paternal Grandfather   . Allergic rhinitis Neg Hx   . Angioedema Neg  Hx   . Eczema Neg Hx   . Immunodeficiency Neg Hx   . Urticaria Neg Hx     Social History Social History   Tobacco Use  . Smoking status: Never Smoker  . Smokeless tobacco: Never Used  . Tobacco comment: smokes hookah  Substance Use Topics  . Alcohol use: Yes  . Drug use: No     Allergies   Oxycodone   Review of Systems Review of Systems   Physical Exam Triage Vital Signs ED Triage Vitals  Enc Vitals Group     BP 11/06/18 1218 136/87     Pulse Rate 11/06/18 1218 80     Resp 11/06/18 1218 18     Temp 11/06/18 1218 98.4 F (36.9 C)     Temp Source 11/06/18 1218 Oral     SpO2 11/06/18 1218 100 %     Weight --      Height --      Head Circumference --      Peak Flow --      Pain Score 11/06/18 1219 10     Pain Loc --      Pain Edu? --      Excl. in [place]? --    No data found.  Updated Vital Signs BP 136/87 (BP Location: Right Arm)   Pulse 80   Temp 98.4 F (36.9 C) (Oral)   Resp 18   LMP 10/09/2018   SpO2 100%    Physical Exam Constitutional:      General: She is not in acute distress.    Appearance: She is well-developed.  HENT:     Right Ear: Hearing, tympanic membrane, ear canal and external ear normal.     Left Ear: Drainage and tenderness present.     Ears:     Comments: Thin white discharge with tenderness to canal to left canal; partially occluded TM due to drainage but no obvious perforation, no bloody discharge noted  Cardiovascular:     Rate and Rhythm: Normal rate.  Pulmonary:     Effort: Pulmonary effort is normal.  Skin:    General: Skin is warm and dry.  Neurological:     Mental Status: She is alert and oriented to person, place, and time.      UC Treatments / Results  Labs (all labs ordered are listed, but only abnormal results are displayed) Labs Reviewed - No data to display  EKG   Radiology No results found.  Procedures Procedures (including critical care time)  Medications Ordered in UC Medications - No data to  display  Initial Impression / Assessment and Plan / UC Course  I have reviewed the triage vital signs and the nursing notes.  Pertinent labs & imaging results that were available during my care of the patient were reviewed by me and considered  in my medical decision making (see chart for details).     Exam consistent with AOE with ciprodex provided. If symptoms worsen or do not improve in the next week to return to be seen or to follow up with PCP. Patient verbalized understanding and agreeable to plan.   Final Clinical Impressions(s) / UC Diagnoses   Final diagnoses:  Acute swimmer's ear of left side     Discharge Instructions     Use of provided drops twice a day for the next week.  Avoid submerging head under water.  See provided information for administration of ear drops.  If symptoms worsen or do not improve in the next week to return to be seen or to follow up with your PCP.     ED Prescriptions    Medication Sig Dispense Auth. Provider   ciprofloxacin-dexamethasone (CIPRODEX) OTIC suspension Place 4 drops into the left ear 2 (two) times daily for 7 days. 7.5 mL Augusto Gamble B, NP     Controlled Substance Prescriptions [place] Controlled Substance Registry consulted? Not Applicable   Zigmund Gottron, NP 11/06/18 1257

## 2018-11-06 NOTE — ED Triage Notes (Signed)
Pt C/O left ear pain, she was scratching her left ear with a Qtip and went to far.  Pt states her ear is throbbing and very painful.

## 2018-11-06 NOTE — Discharge Instructions (Signed)
Use of provided drops twice a day for the next week.  Avoid submerging head under water.  See provided information for administration of ear drops.  If symptoms worsen or do not improve in the next week to return to be seen or to follow up with your PCP.

## 2018-11-09 ENCOUNTER — Encounter (HOSPITAL_COMMUNITY): Payer: Self-pay | Admitting: Emergency Medicine

## 2018-11-09 ENCOUNTER — Ambulatory Visit (HOSPITAL_COMMUNITY)
Admission: EM | Admit: 2018-11-09 | Discharge: 2018-11-09 | Disposition: A | Discharge status: Home / Self Care | Payer: PRIVATE HEALTH INSURANCE | Attending: Family Medicine | Admitting: Family Medicine

## 2018-11-09 ENCOUNTER — Other Ambulatory Visit: Payer: Self-pay

## 2018-11-09 DIAGNOSIS — H60332 Swimmer's ear, left ear: Secondary | ICD-10-CM | POA: Diagnosis not present

## 2018-11-09 MED ORDER — CIPROFLOXACIN HCL 500 MG PO TABS: 500.0000 mg | ORAL_TABLET | Freq: Two times a day (BID) | ORAL | 0 refills | Status: AC

## 2018-11-09 MED ORDER — NEOMYCIN-POLYMYXIN-HC 3.5-10000-1 OT SUSP: 4.0000 [drp] | Freq: Three times a day (TID) | OTIC | 0 refills | Status: AC

## 2018-11-09 NOTE — ED Triage Notes (Signed)
Pt here for continued left ear pain and swelling.  She was seen and treated on Friday and states she uses the drops BID as prescribed, with very little relief.  She denies any fever.

## 2018-11-09 NOTE — ED Provider Notes (Signed)
[place]    CSN: [id number] Arrival date & time: 11/09/18  1356     History   Chief Complaint Chief Complaint  Patient presents with  . Otalgia  . Follow-up    HPI Lori Carson is a 28 y.o. female.   Patient is a 28 year old female the presents today with continued left ear pain.  She was seen here approximate 3 days ago and treated for otitis externa.  She has been using the Cipro drops as prescribed.  She reports that her symptoms have not improved.  She still having some discomfort, trouble hearing and ringing in the ears.  She is also having some left mild facial swelling and trismus.  Symptoms have been constant.  She also gets some sharp pains in the ear at times.  She has been taking ibuprofen without much relief.  Denies any associated fevers, chills or body aches.  Denies any associated respiratory symptoms.  ROS per HPI      Past Medical History:  Diagnosis Date  . Anemia    borderline  . Asthma   . Chest pain    left arm numbness but can take tums and goes away  . Fibroids   . GERD (gastroesophageal reflux disease)     Patient Active Problem List   Diagnosis Date Noted  . Abnormal uterine bleeding 09/06/2016  . Dysmenorrhea 09/06/2016  . Hyperprolactinemia ([place]) 09/06/2016  . Fibroid, uterine 07/08/2016  . Endometriosis 07/08/2016  . Gastroesophageal reflux disease 07/04/2016  . Hiatal hernia 07/04/2016    Past Surgical History:  Procedure Laterality Date  . MYOMECTOMY N/A 07/08/2016   Procedure: MYOMECTOMY with peritoneal biopsy;  Surgeon: Everett Graff, MD;  Location: [facility];  Service: Gynecology;  Laterality: N/A;  . OVARIAN CYST REMOVAL N/A 07/08/2016   Procedure: OVARIAN CYSTECTOMY right, left paratubal cystectomy;  Surgeon: Everett Graff, MD;  Location: [facility];  Service: Gynecology;  Laterality: N/A;  . TONSILLECTOMY    . UPPER GI ENDOSCOPY      OB History   No obstetric history on file.      Home Medications     Prior to Admission medications   Medication Sig Start Date End Date Taking? Authorizing Provider  ciprofloxacin-dexamethasone (CIPRODEX) OTIC suspension Place 4 drops into the left ear 2 (two) times daily for 7 days. 11/06/18 11/13/18 Yes Zigmund Gottron, NP  ibuprofen (ADVIL,MOTRIN) 600 MG tablet 1  po pc every 6 hours for 5 days then prn-pain 07/10/16  Yes Powell, [place], PA-C  Multiple Vitamins-Minerals (MULTIVITAMIN ADULT EXTRA C PO) multivitamin   Yes [provider]  pantoprazole (PROTONIX) 40 MG tablet Take 40 mg by mouth 2 (two) times daily.   Yes [provider]  albuterol (PROVENTIL HFA;VENTOLIN HFA) 108 (90 Base) MCG/ACT inhaler Inhale 2 puffs into the lungs every 4 (four) hours as needed for wheezing or shortness of breath. 07/31/18   Kennith Gain, MD  azelastine (ASTELIN) 0.1 % nasal spray Place 2 sprays into both nostrils 2 (two) times daily. 07/31/18   Kennith Gain, MD  ciprofloxacin (CIPRO) 500 MG tablet Take 1 tablet (500 mg total) by mouth 2 (two) times daily for 7 days. 11/09/18 11/16/18  Loura Halt A, NP  EPINEPHrine (AUVI-Q) 0.3 mg/0.3 mL IJ SOAJ injection Use as directed for severe allergic reaction 09/17/16   Bobbitt, [place] Muta, MD  levocetirizine (XYZAL) 5 MG tablet Take 1 tablet (5 mg total) by mouth every evening. 08/20/18   Kennith Gain, MD  mometasone (NASONEX) 50 MCG/ACT nasal spray Place 2 sprays into the nose daily as needed (congestion).    [provider]  Mometasone Furoate [facility] HFA) 100 MCG/ACT AERO Inhale 2 puffs into the lungs 2 (two) times a day. 08/20/18   Kennith Gain, MD  montelukast (SINGULAIR) 10 MG tablet Take 1 tablet (10 mg total) by mouth at bedtime. 07/31/18   Kennith Gain, MD  naproxen (NAPROSYN) 500 MG tablet naproxen 500 mg tablet  Take 1 tablet twice a day by oral route.    [provider]  neomycin-polymyxin-hydrocortisone (CORTISPORIN) 3.5-10000-1 OTIC  suspension Place 4 drops into the left ear 3 (three) times daily for 10 days. 11/09/18 11/19/18  Loura Halt A, NP  Olopatadine HCl (PATADAY) 0.2 % SOLN Place 1 drop into both eyes daily as needed. 09/06/16   Kennith Gain, MD    Family History Family History  Problem Relation Age of Onset  . Asthma Mother   . Asthma Father   . Asthma Brother   . Asthma Paternal Grandfather   . Allergic rhinitis Neg Hx   . Angioedema Neg Hx   . Eczema Neg Hx   . Immunodeficiency Neg Hx   . Urticaria Neg Hx     Social History Social History   Tobacco Use  . Smoking status: Never Smoker  . Smokeless tobacco: Never Used  . Tobacco comment: smokes hookah  Substance Use Topics  . Alcohol use: Yes  . Drug use: No     Allergies   Oxycodone   Review of Systems Review of Systems   Physical Exam Triage Vital Signs ED Triage Vitals  Enc Vitals Group     BP 11/09/18 1505 108/62     Pulse Rate 11/09/18 1505 64     Resp 11/09/18 1505 12     Temp 11/09/18 1505 98.6 F (37 C)     Temp Source 11/09/18 1505 Oral     SpO2 11/09/18 1505 100 %     Weight --      Height --      Head Circumference --      Peak Flow --      Pain Score 11/09/18 1503 8     Pain Loc --      Pain Edu? --      Excl. in [place]? --    No data found.  Updated Vital Signs BP 108/62 (BP Location: Right Arm)   Pulse 64   Temp 98.6 F (37 C) (Oral)   Resp 12   LMP 11/08/2018 (Exact Date)   SpO2 100%   Visual Acuity Right Eye Distance:   Left Eye Distance:   Bilateral Distance:    Right Eye Near:   Left Eye Near:    Bilateral Near:     Physical Exam Vitals signs and nursing note reviewed.  Constitutional:      General: She is not in acute distress.    Appearance: Normal appearance. She is not ill-appearing, toxic-appearing or diaphoretic.  HENT:     Head: Normocephalic and atraumatic.     Right Ear: There is impacted cerumen.     Left Ear: Decreased hearing noted. Drainage, swelling and  tenderness present. No mastoid tenderness.     Ears:     Comments: Unable to view left TM due to external canal swelling. Drainage noted. Tender to palpation of the tragus, TMJ and left facial area. Mild swelling to face. No mastoid tenderness.     Nose: Nose  normal.     Mouth/Throat:     Pharynx: Oropharynx is clear.  Eyes:     Conjunctiva/sclera: Conjunctivae normal.  Neck:     Musculoskeletal: Normal range of motion.  Pulmonary:     Effort: Pulmonary effort is normal.  Musculoskeletal: Normal range of motion.  Lymphadenopathy:     Cervical: Cervical adenopathy present.  Skin:    General: Skin is warm and dry.  Neurological:     Mental Status: She is alert.  Psychiatric:        Mood and Affect: Mood normal.      UC Treatments / Results  Labs (all labs ordered are listed, but only abnormal results are displayed) Labs Reviewed - No data to display  EKG   Radiology No results found.  Procedures Procedures (including critical care time)  Medications Ordered in UC Medications - No data to display  Initial Impression / Assessment and Plan / UC Course  I have reviewed the triage vital signs and the nursing notes.  Pertinent labs & imaging results that were available during my care of the patient were reviewed by me and considered in my medical decision making (see chart for details).     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  Will step up to oral Ciprofloxacin and neomycin polymyxin hydrocortisone drops Alternating ibuprofen and Tylenol as needed for pain Follow up as needed for continued or worsening symptoms  Final Clinical Impressions(s) / UC Diagnoses   Final diagnoses:  Acute swimmer's ear of left side     Discharge Instructions     Start taking the Cipro 2 x day for 7 days Continue the eardrops as prescribed You can take 600 mg of ibuprofen every 6 hours and 500 mg of Tylenol extra strength every 4-6 hours. If symptoms do not improve or  worsen over the next couple days you need to return for recheck.     ED Prescriptions    Medication Sig Dispense Auth. Provider   neomycin-polymyxin-hydrocortisone (CORTISPORIN) 3.5-10000-1 OTIC suspension Place 4 drops into the left ear 3 (three) times daily for 10 days. 6 mL Arelia Volpe A, NP   ciprofloxacin (CIPRO) 500 MG tablet Take 1 tablet (500 mg total) by mouth 2 (two) times daily for 7 days. 14 tablet Loura Halt A, NP     Controlled Substance Prescriptions [place] [employer] Controlled Substance Registry consulted? Not Applicable   Orvan July, NP 11/09/18 1551

## 2018-11-09 NOTE — Discharge Instructions (Addendum)
Start taking the Cipro 2 x day for 7 days Continue the eardrops as prescribed You can take 600 mg of ibuprofen every 6 hours and 500 mg of Tylenol extra strength every 4-6 hours. If symptoms do not improve or worsen over the next couple days you need to return for recheck.

## 2018-11-25 ENCOUNTER — Telehealth: Payer: Self-pay | Admitting: *Deleted

## 2018-11-25 MED ORDER — PANTOPRAZOLE SODIUM 40 MG PO TBEC: DELAYED_RELEASE_TABLET | ORAL | 3 refills | Status: DC

## 2018-11-25 NOTE — Telephone Encounter (Signed)
Patient is wondering if we can call in Protonix for her. Usually her PCP prescribes it but can not get her in for an appointment and will not call it in. She was last seen in April by [place] and is not due again until October. Pt is willing to do a telephone visit if needed. Pharmacy is [facility] [address] in [place].

## 2018-11-25 NOTE — Telephone Encounter (Signed)
Spoke with Dr. Nelva Bush, and Dr. Nelva Bush stated that she would send in pantoprazole 40mg  once daily. Prescription was sent in and patient was notified.

## 2019-01-29 ENCOUNTER — Ambulatory Visit: Payer: PRIVATE HEALTH INSURANCE | Admitting: Allergy

## 2019-02-12 ENCOUNTER — Encounter: Payer: Self-pay | Admitting: Allergy

## 2019-02-12 ENCOUNTER — Ambulatory Visit (INDEPENDENT_AMBULATORY_CARE_PROVIDER_SITE_OTHER): Payer: PRIVATE HEALTH INSURANCE | Admitting: Allergy

## 2019-02-12 ENCOUNTER — Other Ambulatory Visit: Payer: Self-pay

## 2019-02-12 VITALS — BP 108/64 | HR 80 | Temp 98.1°F | Resp 16

## 2019-02-12 DIAGNOSIS — K219 Gastro-esophageal reflux disease without esophagitis: Secondary | ICD-10-CM

## 2019-02-12 DIAGNOSIS — H1013 Acute atopic conjunctivitis, bilateral: Secondary | ICD-10-CM | POA: Diagnosis not present

## 2019-02-12 DIAGNOSIS — J4520 Mild intermittent asthma, uncomplicated: Secondary | ICD-10-CM | POA: Diagnosis not present

## 2019-02-12 DIAGNOSIS — T781XXD Other adverse food reactions, not elsewhere classified, subsequent encounter: Secondary | ICD-10-CM | POA: Diagnosis not present

## 2019-02-12 DIAGNOSIS — J3089 Other allergic rhinitis: Secondary | ICD-10-CM | POA: Diagnosis not present

## 2019-02-12 DIAGNOSIS — Z20822 Contact with and (suspected) exposure to covid-19: Secondary | ICD-10-CM

## 2019-02-12 MED ORDER — PANTOPRAZOLE SODIUM 40 MG PO TBEC: 40.0000 mg | DELAYED_RELEASE_TABLET | Freq: Two times a day (BID) | ORAL | 5 refills | Status: DC

## 2019-02-12 MED ORDER — OLOPATADINE HCL 0.2 % OP SOLN: OPHTHALMIC | 5 refills | Status: DC

## 2019-02-12 MED ORDER — AZELASTINE HCL 0.1 % NA SOLN: 2.0000 | Freq: Two times a day (BID) | NASAL | 5 refills | Status: DC

## 2019-02-12 MED ORDER — ALBUTEROL SULFATE HFA 108 (90 BASE) MCG/ACT IN AERS: 2.0000 | INHALATION_SPRAY | RESPIRATORY_TRACT | 3 refills | Status: DC | PRN

## 2019-02-12 MED ORDER — ASMANEX HFA 100 MCG/ACT IN AERO: 2.0000 | INHALATION_SPRAY | Freq: Two times a day (BID) | RESPIRATORY_TRACT | 5 refills | Status: DC

## 2019-02-12 MED ORDER — LEVOCETIRIZINE DIHYDROCHLORIDE 5 MG PO TABS: 5.0000 mg | ORAL_TABLET | Freq: Every evening | ORAL | 5 refills | Status: DC

## 2019-02-12 MED ORDER — FLUTICASONE PROPIONATE 50 MCG/ACT NA SUSP: 2.0000 | Freq: Every day | NASAL | 5 refills | Status: DC

## 2019-02-12 NOTE — Progress Notes (Signed)
Follow-up Note  RE: Lori Carson MRN: [id number] DOB: December 30, 1990 Date of Office Visit: 02/12/2019   History of present illness: Lori Carson is a 28 y.o. female presenting today for follow-up of allergic rhinitis with conjunctivitis, asthma and PFAS.  She was last seen in the office on 07/31/2018 by myself.  She states since her last visit that she has been having some issues with her asthma.  She states she has been feeling like her chest feels heavy mostly toward the evenings and she will use her albuterol which does help.  She feels like all of this may be related to anxiety however she is not sure.  She also states that she also has been having more asthma symptoms during exercise.  She states even when she has used her albuterol prior to activity there have been times where she has needed to stop and use her albuterol again to relieve the symptoms.  She states she has not had any wheezing however.  She denies nighttime awakenings.  She states after the last visit she did use the Asmanex samples that we have provided her daily however as she was getting close to running out she is started using it every other day and then started using it more as needed.  She states that the prescription for the Asmanex was too expensive for her to get this spring.  She however feels that she is in a better position to be able to afford the inhaler at this time.  She states she also stopped taking the Singulair but she is not sure why she stopped. She states with her allergy symptoms that she has been having some increased nasal and ocular symptoms however the Xyzal has been helpful at improving the symptoms.  She also states both the nose sprays Astelin and Flonase are helpful when she does use them.  She also has Pataday that is also helpful as well. She continues to avoid green peas without any accidental ingestions. She also states that taking pantoprazole daily helps to control her reflux.  She also has  been avoiding known foods that trigger her reflux. She also states that her right ear has been draining more looser earwax that is a different color and has an older than her left ear.  She denies any fever or pain of the ear.  Review of systems: Review of Systems  Constitutional: Negative for chills, fever and malaise/fatigue.  HENT: Positive for congestion. Negative for ear discharge, nosebleeds and sore throat.   Eyes: Negative for pain, discharge and redness.  Respiratory: Positive for cough and shortness of breath. Negative for wheezing.   Cardiovascular: Negative.   Gastrointestinal: Negative.   Musculoskeletal: Negative.   Skin: Negative for itching and rash.  Neurological: Negative.     All other systems negative unless noted above in HPI  Past medical/social/surgical/family history have been reviewed and are unchanged unless specifically indicated below.  No changes  Medication List: Current Outpatient Medications  Medication Sig Dispense Refill  . albuterol (VENTOLIN HFA) 108 (90 Base) MCG/ACT inhaler Inhale 2 puffs into the lungs every 4 (four) hours as needed for wheezing or shortness of breath. 18 g 3  . azelastine (ASTELIN) 0.1 % nasal spray Place 2 sprays into both nostrils 2 (two) times daily. (Patient taking differently: Place 2 sprays into both nostrils 2 (two) times daily. As needed.) 30 mL 5  . EPINEPHrine (AUVI-Q) 0.3 mg/0.3 mL IJ SOAJ injection Use as directed for severe allergic reaction 2  Device 1  . ibuprofen (ADVIL,MOTRIN) 600 MG tablet 1  po pc every 6 hours for 5 days then prn-pain 30 tablet 1  . levocetirizine (XYZAL) 5 MG tablet Take 1 tablet (5 mg total) by mouth every evening. 30 tablet 5  . mometasone (NASONEX) 50 MCG/ACT nasal spray Place 2 sprays into the nose daily as needed (congestion).    . Mometasone Furoate (ASMANEX HFA) 100 MCG/ACT AERO Inhale 2 puffs into the lungs 2 (two) times a day. (Patient taking differently: Inhale 2 puffs into the lungs  2 (two) times a day. Using the sample given as needed.) 13 g 5  . montelukast (SINGULAIR) 10 MG tablet Take 1 tablet (10 mg total) by mouth at bedtime. (Patient taking differently: Take 10 mg by mouth at bedtime. As needed.) 30 tablet 5  . Multiple Vitamins-Minerals (MULTIVITAMIN ADULT EXTRA C PO) multivitamin    . naproxen (NAPROSYN) 500 MG tablet naproxen 500 mg tablet  Take 1 tablet twice a day by oral route.    . Olopatadine HCl (PATADAY) 0.2 % SOLN Place 1 drop into both eyes daily as needed. 1 Bottle 5  . pantoprazole (PROTONIX) 40 MG tablet Take 1 tablet (40 mg total) by mouth 2 (two) times daily. 60 tablet 5  . azelastine (ASTELIN) 0.1 % nasal spray Place 2 sprays into both nostrils 2 (two) times daily. As needed. 30 mL 5  . fluticasone (FLONASE) 50 MCG/ACT nasal spray Place 2 sprays into both nostrils daily. As needed. 1 g 5  . levocetirizine (XYZAL) 5 MG tablet Take 1 tablet (5 mg total) by mouth every evening. 30 tablet 5  . Mometasone Furoate (ASMANEX HFA) 100 MCG/ACT AERO Inhale 2 puffs into the lungs 2 (two) times daily. 13 g 5  . Olopatadine HCl (PATADAY) 0.2 % SOLN Use 1 drop in each eye once daily as needed. 2.5 mL 5   No current facility-administered medications for this visit.      Known medication allergies: Allergies  Allergen Reactions  . Oxycodone Itching     Physical examination: Blood pressure 108/64, pulse 80, temperature 98.1 F (36.7 C), temperature source Temporal, resp. rate 16, SpO2 98 %.  General: Alert, interactive, in no acute distress. HEENT: PERRLA, TMs pearly gray without cerumen, turbinates minimally edematous without discharge, post-pharynx non erythematous. Neck: Supple without lymphadenopathy. Lungs: Clear to auscultation without wheezing, rhonchi or rales. {no increased work of breathing. CV: Normal S1, S2 without murmurs. Abdomen: Nondistended, nontender. Skin: Warm and dry, without lesions or rashes. Extremities:  No clubbing, cyanosis or  edema. Neuro:   Grossly intact.  Diagnositics/Labs:  Spirometry: FEV1: 3.55L 117%, FVC: 4.31L 121%, ratio consistent with nonobstructive pattern   Assessment and plan:   Allergic rhinitis with conjunctivitis  - continue avoidance measures for grasses, trees, weeds, molds, dust mites, cat, dog, horse, cockroach, mouse.  - continue nasal saline rinse daily.  Use distilled water or boil water and bring to room temp prior to use.  Use prior to using her medicated nasal sprays  - continue Astelin 2 sprays each nostril twice a day as needed to help with nasal drainage/post-nasal drip    - continue Flonase 2 sprays each nostril daily as neededfor nasal congestion  - continue Xyzal 5mg  daily   - resume Singulair 10 mg daily  - continue Pataday 1 drop each eye as needed for itchy, watery, red eyes  - if continues to have odorous, discolored cerumen then would recommend ENT evaluation  Intermittent asthma  -  continue as needed use of albuterol inhaler  2 puffs or nebulizer 1 vial every 4-6 hours for cough, wheeze, difficulty breathing or chest tightness. Monitor frequency of use.   - resume Singulair as above - use Asmanex 2 puffs twice a day for improved control in evenings and with exercise  Asthma control goals:   Full participation in all desired activities (may need albuterol before activity)  Albuterol use two time or less a week on average (not counting use with activity)  Cough interfering with sleep two time or less a month  Oral steroids no more than once a year  No hospitalizations  Food allergy/pollen oral food syndrome Food testing for green pea is slightly positive on previous testing.   Would continue avoidance at this time   Reflux  - continue pantoprazole 40mg  daily  follow-up in 4-6 months or sooner if needed   I appreciate the opportunity to take part in Fatisha's care. Please do not hesitate to contact me with questions.  Sincerely,   Prudy Feeler, MD  Allergy/Immunology [facility]

## 2019-02-12 NOTE — Patient Instructions (Addendum)
Allergic rhinitis with conjunctivitis  - continue avoidance measures for grasses, trees, weeds, molds, dust mites, cat, dog, horse, cockroach, mouse.  - continue nasal saline rinse daily.  Use distilled water or boil water and bring to room temp prior to use.  Use prior to using her medicated nasal sprays  - continue Astelin 2 sprays each nostril twice a day as needed to help with nasal drainage/post-nasal drip    - continue Flonase 2 sprays each nostril daily as neededfor nasal congestion  - continue Xyzal 5mg  daily   - resume Singulair 10 mg daily  - continue Pataday 1 drop each eye as needed for itchy, watery, red eyes  Intermittent asthma  - continue as needed use of albuterol inhaler  2 puffs or nebulizer 1 vial every 4-6 hours for cough, wheeze, difficulty breathing or chest tightness. Monitor frequency of use.   - resume Singulair as above - use Asmanex 2 puffs twice a day for improved control in evenings and with exercise  Asthma control goals:   Full participation in all desired activities (may need albuterol before activity)  Albuterol use two time or less a week on average (not counting use with activity)  Cough interfering with sleep two time or less a month  Oral steroids no more than once a year  No hospitalizations  Food allergy/pollen oral food syndrome Food testing for green pea is slightly positive on previous testing.   Would continue avoidance at this time   Reflux  - continue pantoprazole 40mg  daily  follow-up in 4-6 months or sooner if needed

## 2019-02-14 LAB — NOVEL CORONAVIRUS, NAA: SARS-CoV-2, NAA: NOT DETECTED

## 2019-03-23 ENCOUNTER — Other Ambulatory Visit: Payer: Self-pay

## 2019-03-23 DIAGNOSIS — Z20822 Contact with and (suspected) exposure to covid-19: Secondary | ICD-10-CM

## 2019-03-24 LAB — NOVEL CORONAVIRUS, NAA: SARS-CoV-2, NAA: NOT DETECTED

## 2019-04-20 ENCOUNTER — Ambulatory Visit: Payer: PRIVATE HEALTH INSURANCE | Attending: Internal Medicine

## 2019-04-20 DIAGNOSIS — Z20822 Contact with and (suspected) exposure to covid-19: Secondary | ICD-10-CM

## 2019-04-21 LAB — NOVEL CORONAVIRUS, NAA: SARS-CoV-2, NAA: NOT DETECTED

## 2019-05-05 ENCOUNTER — Ambulatory Visit: Payer: PRIVATE HEALTH INSURANCE | Attending: Internal Medicine

## 2019-05-05 DIAGNOSIS — Z20822 Contact with and (suspected) exposure to covid-19: Secondary | ICD-10-CM

## 2019-05-06 LAB — NOVEL CORONAVIRUS, NAA: SARS-CoV-2, NAA: NOT DETECTED

## 2019-07-09 ENCOUNTER — Other Ambulatory Visit: Payer: Self-pay | Admitting: Obstetrics and Gynecology

## 2019-07-09 DIAGNOSIS — N809 Endometriosis, unspecified: Secondary | ICD-10-CM

## 2019-07-09 DIAGNOSIS — R102 Pelvic and perineal pain: Secondary | ICD-10-CM

## 2019-07-13 ENCOUNTER — Ambulatory Visit
Admission: RE | Admit: 2019-07-13 | Discharge: 2019-07-13 | Disposition: A | Discharge status: Home / Self Care | Payer: PRIVATE HEALTH INSURANCE | Source: Ambulatory Visit | Attending: Obstetrics and Gynecology | Admitting: Obstetrics and Gynecology

## 2019-07-13 DIAGNOSIS — R102 Pelvic and perineal pain: Secondary | ICD-10-CM

## 2019-07-13 DIAGNOSIS — N809 Endometriosis, unspecified: Secondary | ICD-10-CM

## 2019-07-16 ENCOUNTER — Ambulatory Visit: Payer: PRIVATE HEALTH INSURANCE | Admitting: Allergy

## 2019-08-05 ENCOUNTER — Other Ambulatory Visit: Payer: Self-pay | Admitting: Obstetrics and Gynecology

## 2019-08-11 ENCOUNTER — Telehealth: Payer: Self-pay | Admitting: Physician Assistant

## 2019-08-11 NOTE — Telephone Encounter (Signed)
Received a new hem referral from Dr. Mancel Bale at [place] for anemia. Lori Carson has been cld and scheduled to see Cassie on 4/20 at 1:30pm w/labs at 1pm. Pt aware to arrive 15 minutes early.

## 2019-08-15 ENCOUNTER — Other Ambulatory Visit: Payer: Self-pay | Admitting: Physician Assistant

## 2019-08-15 DIAGNOSIS — D649 Anemia, unspecified: Secondary | ICD-10-CM

## 2019-08-17 ENCOUNTER — Other Ambulatory Visit: Payer: Self-pay

## 2019-08-17 ENCOUNTER — Encounter: Payer: Self-pay | Admitting: Physician Assistant

## 2019-08-17 ENCOUNTER — Inpatient Hospital Stay: Payer: PRIVATE HEALTH INSURANCE | Attending: Physician Assistant | Admitting: Physician Assistant

## 2019-08-17 ENCOUNTER — Inpatient Hospital Stay: Payer: PRIVATE HEALTH INSURANCE

## 2019-08-17 ENCOUNTER — Telehealth: Payer: Self-pay | Admitting: Physician Assistant

## 2019-08-17 VITALS — BP 129/84 | HR 58 | Temp 99.1°F | Resp 18 | Ht 67.0 in | Wt 176.2 lb

## 2019-08-17 DIAGNOSIS — D649 Anemia, unspecified: Secondary | ICD-10-CM

## 2019-08-17 DIAGNOSIS — N920 Excessive and frequent menstruation with regular cycle: Secondary | ICD-10-CM | POA: Diagnosis not present

## 2019-08-17 DIAGNOSIS — D500 Iron deficiency anemia secondary to blood loss (chronic): Secondary | ICD-10-CM | POA: Diagnosis present

## 2019-08-17 DIAGNOSIS — D509 Iron deficiency anemia, unspecified: Secondary | ICD-10-CM | POA: Insufficient documentation

## 2019-08-17 DIAGNOSIS — N809 Endometriosis, unspecified: Secondary | ICD-10-CM | POA: Diagnosis not present

## 2019-08-17 DIAGNOSIS — D259 Leiomyoma of uterus, unspecified: Secondary | ICD-10-CM | POA: Diagnosis not present

## 2019-08-17 DIAGNOSIS — N939 Abnormal uterine and vaginal bleeding, unspecified: Secondary | ICD-10-CM

## 2019-08-17 LAB — CBC WITH DIFFERENTIAL (CANCER CENTER ONLY)
Abs Immature Granulocytes: 0.02 10*3/uL (ref 0.00–0.07)
Basophils Absolute: 0 10*3/uL (ref 0.0–0.1)
Basophils Relative: 0 %
Eosinophils Absolute: 0.1 10*3/uL (ref 0.0–0.5)
Eosinophils Relative: 2 %
HCT: 29.8 % — ABNORMAL LOW (ref 36.0–46.0)
Hemoglobin: 8.3 g/dL — ABNORMAL LOW (ref 12.0–15.0)
Immature Granulocytes: 0 %
Lymphocytes Relative: 33 %
Lymphs Abs: 2.5 10*3/uL (ref 0.7–4.0)
MCH: 18.9 pg — ABNORMAL LOW (ref 26.0–34.0)
MCHC: 27.9 g/dL — ABNORMAL LOW (ref 30.0–36.0)
MCV: 67.7 fL — ABNORMAL LOW (ref 80.0–100.0)
Monocytes Absolute: 0.7 10*3/uL (ref 0.1–1.0)
Monocytes Relative: 9 %
Neutro Abs: 4.3 10*3/uL (ref 1.7–7.7)
Neutrophils Relative %: 56 %
Platelet Count: 338 10*3/uL (ref 150–400)
RBC: 4.4 MIL/uL (ref 3.87–5.11)
RDW: 19.1 % — ABNORMAL HIGH (ref 11.5–15.5)
WBC Count: 7.7 10*3/uL (ref 4.0–10.5)
nRBC: 0 % (ref 0.0–0.2)

## 2019-08-17 LAB — CMP (CANCER CENTER ONLY)
ALT: 8 U/L (ref 0–44)
AST: 15 U/L (ref 15–41)
Albumin: 3.4 g/dL — ABNORMAL LOW (ref 3.5–5.0)
Alkaline Phosphatase: 53 U/L (ref 38–126)
Anion gap: 11 (ref 5–15)
BUN: 6 mg/dL (ref 6–20)
CO2: 24 mmol/L (ref 22–32)
Calcium: 8.8 mg/dL — ABNORMAL LOW (ref 8.9–10.3)
Chloride: 103 mmol/L (ref 98–111)
Creatinine: 0.85 mg/dL (ref 0.44–1.00)
GFR, Est AFR Am: >60 mL/min (ref >60)
GFR, Estimated: >60 mL/min (ref >60)
Glucose, Bld: 85 mg/dL (ref 70–99)
Potassium: 3.5 mmol/L (ref 3.5–5.1)
Sodium: 138 mmol/L (ref 135–145)
Total Bilirubin: 0.4 mg/dL (ref 0.3–1.2)
Total Protein: 7.3 g/dL (ref 6.5–8.1)

## 2019-08-17 LAB — IRON AND TIBC
Iron: 13 ug/dL — ABNORMAL LOW (ref 41–142)
Saturation Ratios: 2 % — ABNORMAL LOW (ref 21–57)
TIBC: 511 ug/dL — ABNORMAL HIGH (ref 236–444)
UIBC: 499 ug/dL — ABNORMAL HIGH (ref 120–384)

## 2019-08-17 LAB — SAMPLE TO BLOOD BANK

## 2019-08-17 NOTE — Telephone Encounter (Signed)
Scheduled per los. Gave avs and calendar  

## 2019-08-17 NOTE — Progress Notes (Signed)
Lori Carson [phone]   [phone]  CONSULT NOTE  REFERRING PHYSICIAN: Dr. Mancel Bale OB/GYN  REASON FOR CONSULTATION:  Iron deficiency anemia  HPI Norrene Carson is a 29 y.o. female  with a past medical history significant for uterine fibroids (status post myomectomy in 2018), abnormal uterine bleeding, menorrhagia, endometriosis, depression, anxiety, asthma, HPV, and GERD is referred to the clinic for evaluation of anemia.  The patient was recently seen by her OB/GYN last week for medication management of her fibroids, endometriosis, menorrhagia, and abnormal uterine bleeding. She states that she has been having abnormal uterine bleeding for 2 months consistently.  The bleeding stopped approximately 1 week ago. She states prior to her myomectomy in 2018, she also had a similar episode of heavy and prolonged menstrual bleeding which improved after her procedure.  From 2018 to February 2021, the patient states she had regular menstrual periods every 4 weeks.  She states that her menstrual periods last approximately 8 days.  She states that her menstrual periods are heavier the first 3 days in which she uses ultra sized tampons and states there are periods in which she would need to change that every hour.   When she saw her OBGYN last week, she had a repeat CBC performed which showed microcytic anemia with a hemoglobin of 8.3, MCV low at 67, elevated RDW at 17.8.  Her platelets and white blood cells were within normal limits.  The patient is unsure how long she has been anemic for.  Per chart review, it appears that the patient has had mild anemia for the last 2 years with hemoglobin ranging from 10-11.5.  The patient has never required an iron or a blood transfusion.  The patient states that she has been taking iron supplements since early March 2021.  He states that she takes her iron supplements every other day due to it contributing to  constipation.  Today, the patient is noting fatigue and decreased concentration/focus.  She denies any syncope, chest pain, shortness of breath, or palpitations.   She denies any other abnormal bleeding or bruising including epistaxis, gingival bleeding, melena, hematochezia, hematuria, hemoptysis, or hematemesis.  The patient denies any family history for any significant anemia except for her cousin who also reportedly has endometriosis and anemia.  The patient's mother is relatively healthy has a history of fibroids.  The patient's paternal grandfather had coronary artery disease, hypertension, diabetes.  The patient's maternal grandmother had gallbladder cancer, hypertension, and diabetes.  The patient has a brother who is healthy. The patient's father is healthy.  The patient works as a Psychologist, sport and exercise.  She is single does not have any children.  She denies any tobacco use or drug use.  She states that she is approximately 3 alcoholic beverages per week. HPI  Past Medical History:  Diagnosis Date  . Anemia    borderline  . Asthma   . Chest pain    left arm numbness but can take tums and goes away  . Fibroids   . GERD (gastroesophageal reflux disease)     Past Surgical History:  Procedure Laterality Date  . MYOMECTOMY N/A 07/08/2016   Procedure: MYOMECTOMY with peritoneal biopsy;  Surgeon: Everett Graff, MD;  Location: [facility];  Service: Gynecology;  Laterality: N/A;  . OVARIAN CYST REMOVAL N/A 07/08/2016   Procedure: OVARIAN CYSTECTOMY right, left paratubal cystectomy;  Surgeon: Everett Graff, MD;  Location: [facility];  Service: Gynecology;  Laterality: N/A;  . TONSILLECTOMY    .  UPPER GI ENDOSCOPY      Family History  Problem Relation Age of Onset  . Asthma Mother   . Asthma Father   . Asthma Brother   . Asthma Paternal Grandfather   . Allergic rhinitis Neg Hx   . Angioedema Neg Hx   . Eczema Neg Hx   . Immunodeficiency Neg Hx   . Urticaria Neg Hx     Social  History Social History   Tobacco Use  . Smoking status: Never Smoker  . Smokeless tobacco: Never Used  . Tobacco comment: smokes hookah  Substance Use Topics  . Alcohol use: Yes  . Drug use: No    Allergies  Allergen Reactions  . Oxycodone Itching    Current Outpatient Medications  Medication Sig Dispense Refill  . albuterol (VENTOLIN HFA) 108 (90 Base) MCG/ACT inhaler Inhale 2 puffs into the lungs every 4 (four) hours as needed for wheezing or shortness of breath. 18 g 3  . ALPRAZolam (XANAX) 0.25 MG tablet Take 1 tablet by mouth 2 (two) times daily as needed.    Marland Kitchen azelastine (ASTELIN) 0.1 % nasal spray Place 2 sprays into both nostrils 2 (two) times daily. (Patient taking differently: Place 2 sprays into both nostrils 2 (two) times daily. As needed.) 30 mL 5  . Cholecalciferol 1.25 MG (50000 UT) capsule Take 1 capsule by mouth daily.    . cyclobenzaprine (FLEXERIL) 10 MG tablet Take 1 tablet by mouth every 8 (eight) hours as needed.    . Elagolix Sodium (ORILISSA) 150 MG TABS Take 1 tablet by mouth daily.    [employer] (ORIAHNN) 300-1-0.5 & 300 MG CPPK Take 1 application by mouth See admin instructions. Oriahnn 300-1-0.5 mg(am)/300 mg (pm) capsules Take 1 capsule every day by oral route.  Stop Orlissa and norethindrone when you start this medication.    Marland Kitchen EPINEPHrine (AUVI-Q) 0.3 mg/0.3 mL IJ SOAJ injection Use as directed for severe allergic reaction 2 Device 1  . fluticasone (FLONASE) 50 MCG/ACT nasal spray Place 2 sprays into both nostrils daily. As needed. 1 g 5  . HYDROmorphone (DILAUDID) 2 MG tablet Take 1 tablet by mouth every 4 (four) hours as needed.    Marland Kitchen ibuprofen (ADVIL,MOTRIN) 600 MG tablet 1  po pc every 6 hours for 5 days then prn-pain 30 tablet 1  . levocetirizine (XYZAL) 5 MG tablet Take 1 tablet (5 mg total) by mouth every evening. 30 tablet 5  . mometasone (NASONEX) 50 MCG/ACT nasal spray Place 2 sprays into the nose daily as needed  (congestion).    . Mometasone Furoate (ASMANEX HFA) 100 MCG/ACT AERO Inhale 2 puffs into the lungs 2 (two) times a day. (Patient taking differently: Inhale 2 puffs into the lungs 2 (two) times a day. Using the sample given as needed.) 13 g 5  . Mometasone Furoate (ASMANEX HFA) 100 MCG/ACT AERO Inhale 2 puffs into the lungs 2 (two) times daily. 13 g 5  . montelukast (SINGULAIR) 10 MG tablet Take 1 tablet (10 mg total) by mouth at bedtime. (Patient taking differently: Take 10 mg by mouth at bedtime. As needed.) 30 tablet 5  . Multiple Vitamins-Minerals (MULTIVITAMIN ADULT EXTRA C PO) multivitamin    . naproxen (NAPROSYN) 500 MG tablet naproxen 500 mg tablet  Take 1 tablet twice a day by oral route.    . norethindrone (AYGESTIN) 5 MG tablet Take 2 tablets by mouth daily. fibroids    . Olopatadine HCl (PATADAY) 0.2 % SOLN Place 1 drop  into both eyes daily as needed. 1 Bottle 5  . pantoprazole (PROTONIX) 40 MG tablet Take 1 tablet (40 mg total) by mouth 2 (two) times daily. 60 tablet 5  . terconazole (TERAZOL 7) 0.4 % vaginal cream Place 1 applicator vaginally at bedtime as needed.     No current facility-administered medications for this visit.    Review of Systems REVIEW OF SYSTEMS:   Review of Systems  Constitutional: Positive for fatigue. Negative for appetite change, chills, fever and unexpected weight change.  HENT: Negative for mouth sores, nosebleeds, sore throat and trouble swallowing.   Eyes: Negative for eye problems and icterus.  Respiratory: Negative for cough, hemoptysis, shortness of breath and wheezing.  Cardiovascular: Negative for chest pain and leg swelling.  Gastrointestinal: Negative for abdominal pain, constipation, diarrhea, nausea and vomiting.  Genitourinary: Negative for bladder incontinence, difficulty urinating, dysuria, frequency and hematuria.   Musculoskeletal: Negative for back pain, gait problem, neck pain and neck stiffness.  Skin: Negative for itching and rash.   Neurological: Negative for dizziness, extremity weakness, gait problem, headaches, light-headedness and seizures.  Hematological: Positive for menorrhagia. negative for adenopathy. Does not bruise easily.  Denies any other signs and symptoms of bleeding including hemoptysis, hematemesis, epistaxis, melena, hematochezia, or hematuria. Psychiatric/Behavioral: Positive for "memory fog". negative for confusion, depression and sleep disturbance. The patient is not nervous/anxious.     PHYSICAL EXAMINATION:  Blood pressure 129/84, pulse (!) 58, temperature 99.1 F (37.3 C), temperature source Temporal, resp. rate 18, height 5\' 7"  (1.702 m), weight 176 lb 3.2 oz (79.9 kg), SpO2 100 %.  ECOG PERFORMANCE STATUS: 1  Physical Exam  Constitutional: Oriented to person, place, and time and well-developed, well-nourished, and in no distress.  HENT:  Head: Normocephalic and atraumatic.  Mouth/Throat: Oropharynx is clear and moist. No oropharyngeal exudate.  Eyes: Conjunctivae are normal. Right eye exhibits no discharge. Left eye exhibits no discharge. No scleral icterus.  Neck: Normal range of motion. Neck supple.  Cardiovascular: Normal rate, regular rhythm, normal heart sounds and intact distal pulses.   Pulmonary/Chest: Effort normal and breath sounds normal. No respiratory distress. No wheezes. No rales.  Abdominal: Soft. Bowel sounds are normal. Exhibits no distension and no mass. There is no tenderness.  Musculoskeletal: Normal range of motion. Exhibits no edema.  Lymphadenopathy:    No cervical adenopathy.  Neurological: Alert and oriented to person, place, and time. Exhibits normal muscle tone. Gait normal. Coordination normal.  Skin: Skin is warm and dry. No rash noted. Not diaphoretic. No erythema. No pallor.  Psychiatric: Mood, memory and judgment normal.  Vitals reviewed.  LABORATORY DATA: Lab Results  Component Value Date   WBC 7.7 08/17/2019   HGB 8.3 (L) 08/17/2019   HCT 29.8 (L)  08/17/2019   MCV 67.7 (L) 08/17/2019   PLT 338 08/17/2019      Chemistry      Component Value Date/Time   NA 138 08/17/2019 1315   K 3.5 08/17/2019 1315   CL 103 08/17/2019 1315   CO2 24 08/17/2019 1315   BUN 6 08/17/2019 1315   CREATININE 0.85 08/17/2019 1315      Component Value Date/Time   CALCIUM 8.8 (L) 08/17/2019 1315   ALKPHOS 53 08/17/2019 1315   AST 15 08/17/2019 1315   ALT 8 08/17/2019 1315   BILITOT 0.4 08/17/2019 1315       RADIOGRAPHIC STUDIES: No results found.  ASSESSMENT: This is a very pleasant 29 year old African-American female referred to the clinic for iron deficiency  anemia.   PLAN: The patient was seen with Dr. Julien Nordmann today.  The patient had a repeat CBC, iron studies, and ferritin performed today.  The patient CBC shows microcytic anemia with a hemoglobin of 8.3.  Her iron studies show significant iron deficiency with a total iron low at 13, TIBC elevated at 511, iron saturation very low at 2%, and UIBC elevated at 499.  Her ferritin is still pending at this time.  Dr. Julien Nordmann recommends that we arrange for weekly IV iron infusions with Venofer x4.  We will arrange the first dose this Friday on 08/20/2019.  We will consider blood transfusion if her hemoglobin <8.   The patient was advised to continue to take her oral iron supplement.  Dr. Julien Nordmann discussed that she may take this is much as 3 times a day.  She was encouraged to take her iron supplements with vitamin C.  Discussed that iron supplements may cause constipation and she use stool softeners.  We will see her back for follow-up visit in 6 weeks for evaluation and repeat iron studies with a CBC, ferritin, and iron studies.  However, discussed that if she develops worsening signs and symptoms of anemia, that we can arrange for repeat blood work to assess her sooner.  Strongly encourage the patient to follow-up with her OB/GYN closely to manage her significant menstrual bleeding.  The patient  voices understanding of current disease status and treatment options and is in agreement with the current care plan.  All questions were answered. The patient knows to call the clinic with any problems, questions or concerns. We can certainly see the patient much sooner if necessary.  Thank you so much for allowing me to participate in the care of Lori Carson. I will continue to follow up the patient with you and assist in her care.  The total time spent in the appointment was 60 minutes.  Disclaimer: This note was dictated with voice recognition software. Similar sounding words can inadvertently be transcribed and may not be corrected upon review.   Lori Carson August 17, 2019, 3:53 PM  ADDENDUM: Hematology/Oncology Attending: I had a face-to-face encounter with the patient today.  I recommended her care plan.  This is a very pleasant 29 years old African-American female with persistent iron deficiency anemia secondary to menorrhagia from uterine fibroids as well as endometriosis. The patient is currently on oral iron tablets but there is no significant improvement in her anemia.  Repeat CBC today showed hemoglobin of 8.1 and severe iron deficiency with ferritin level less than 2. I had a lengthy discussion today with the patient about her condition and treatment options. I recommended for the patient treatment with intravenous iron infusion with Venofer weekly for 4 weeks. We will arrange for the patient to have repeat CBC, iron study and ferritin in 6 weeks for further evaluation of her condition and response to the iron infusion.  She was also advised to continue on the oral iron tablet twice daily. She will follow with her OB/GYN for close monitoring of her fibroid tumor and endometriosis. The patient was advised to call immediately if she has any other concerning symptoms in the interval.  Disclaimer: This note was dictated with voice recognition software. Similar  sounding words can inadvertently be transcribed and may be missed upon review. Lori Kempf, MD 08/17/19

## 2019-08-18 LAB — FERRITIN: Ferritin: <4 ng/mL — ABNORMAL LOW (ref 11–307)

## 2019-08-20 ENCOUNTER — Other Ambulatory Visit: Payer: Self-pay

## 2019-08-20 ENCOUNTER — Inpatient Hospital Stay: Payer: PRIVATE HEALTH INSURANCE

## 2019-08-20 VITALS — BP 120/73 | HR 57 | Temp 99.0°F | Resp 18

## 2019-08-20 DIAGNOSIS — D500 Iron deficiency anemia secondary to blood loss (chronic): Secondary | ICD-10-CM | POA: Diagnosis not present

## 2019-08-20 MED ORDER — DIPHENHYDRAMINE HCL 25 MG PO CAPS
ORAL_CAPSULE | ORAL | Status: AC
  Filled 2019-08-20: qty 1

## 2019-08-20 MED ORDER — ACETAMINOPHEN 325 MG PO TABS
650.0000 mg | ORAL_TABLET | Freq: Once | ORAL | Status: AC
  Administered 2019-08-20: 09:00:00 650 mg via ORAL

## 2019-08-20 MED ORDER — DIPHENHYDRAMINE HCL 25 MG PO CAPS
50.0000 mg | ORAL_CAPSULE | Freq: Once | ORAL | Status: AC
  Administered 2019-08-20: 50 mg via ORAL

## 2019-08-20 MED ORDER — SODIUM CHLORIDE 0.9 % IV SOLN
Freq: Once | INTRAVENOUS | Status: AC
  Filled 2019-08-20: qty 250

## 2019-08-20 MED ORDER — SODIUM CHLORIDE 0.9 % IV SOLN
200.0000 mg | Freq: Once | INTRAVENOUS | Status: AC
  Administered 2019-08-20: 200 mg via INTRAVENOUS
  Filled 2019-08-20: qty 200

## 2019-08-20 MED ORDER — ACETAMINOPHEN 325 MG PO TABS
ORAL_TABLET | ORAL | Status: AC
  Filled 2019-08-20: qty 2

## 2019-08-20 NOTE — Patient Instructions (Signed)
Iron Sucrose (Venofer) injection What is this medicine? IRON SUCROSE (AHY ern SOO krohs) is an iron complex. Iron is used to make healthy red blood cells, which carry oxygen and nutrients throughout the body. This medicine is used to treat iron deficiency anemia in people with chronic kidney disease. This medicine may be used for other purposes; ask your health care provider or pharmacist if you have questions. COMMON BRAND NAME(S): Venofer What should I tell my health care provider before I take this medicine? They need to know if you have any of these conditions:  anemia not caused by low iron levels  heart disease  high levels of iron in the blood  kidney disease  liver disease  an unusual or allergic reaction to iron, other medicines, foods, dyes, or preservatives  pregnant or trying to get pregnant  breast-feeding How should I use this medicine? This medicine is for infusion into a vein. It is given by a health care professional in a hospital or clinic setting. Talk to your pediatrician regarding the use of this medicine in children. While this drug may be prescribed for children as young as 2 years for selected conditions, precautions do apply. Overdosage: If you think you have taken too much of this medicine contact a poison control center or emergency room at once. NOTE: This medicine is only for you. Do not share this medicine with others. What if I miss a dose? It is important not to miss your dose. Call your doctor or health care professional if you are unable to keep an appointment. What may interact with this medicine? Do not take this medicine with any of the following medications:  deferoxamine  dimercaprol  other iron products This medicine may also interact with the following medications:  chloramphenicol  deferasirox This list may not describe all possible interactions. Give your health care provider a list of all the medicines, herbs, non-prescription  drugs, or dietary supplements you use. Also tell them if you smoke, drink alcohol, or use illegal drugs. Some items may interact with your medicine. What should I watch for while using this medicine? Visit your doctor or healthcare professional regularly. Tell your doctor or healthcare professional if your symptoms do not start to get better or if they get worse. You may need blood work done while you are taking this medicine. You may need to follow a special diet. Talk to your doctor. Foods that contain iron include: whole grains/cereals, dried fruits, beans, or peas, leafy green vegetables, and organ meats (liver, kidney). What side effects may I notice from receiving this medicine? Side effects that you should report to your doctor or health care professional as soon as possible:  allergic reactions like skin rash, itching or hives, swelling of the face, lips, or tongue  breathing problems  changes in blood pressure  cough  fast, irregular heartbeat  feeling faint or lightheaded, falls  fever or chills  flushing, sweating, or hot feelings  joint or muscle aches/pains  seizures  swelling of the ankles or feet  unusually weak or tired Side effects that usually do not require medical attention (report to your doctor or health care professional if they continue or are bothersome):  diarrhea  feeling achy  headache  irritation at site where injected  nausea, vomiting  stomach upset  tiredness This list may not describe all possible side effects. Call your doctor for medical advice about side effects. You may report side effects to FDA at 1-800-FDA-1088. Where should I  keep my medicine? This drug is given in a hospital or clinic and will not be stored at home. NOTE: This sheet is a summary. It may not cover all possible information. If you have questions about this medicine, talk to your doctor, pharmacist, or health care provider.  2020 Elsevier/Gold Standard  (2011-01-24 17:14:35)

## 2019-08-26 ENCOUNTER — Other Ambulatory Visit: Payer: Self-pay

## 2019-08-26 ENCOUNTER — Inpatient Hospital Stay: Payer: PRIVATE HEALTH INSURANCE

## 2019-08-26 VITALS — BP 110/59 | HR 69 | Temp 98.7°F | Resp 18

## 2019-08-26 DIAGNOSIS — D500 Iron deficiency anemia secondary to blood loss (chronic): Secondary | ICD-10-CM

## 2019-08-26 MED ORDER — DIPHENHYDRAMINE HCL 25 MG PO CAPS
50.0000 mg | ORAL_CAPSULE | Freq: Once | ORAL | Status: AC
  Administered 2019-08-26: 50 mg via ORAL

## 2019-08-26 MED ORDER — SODIUM CHLORIDE 0.9 % IV SOLN
200.0000 mg | Freq: Once | INTRAVENOUS | Status: AC
  Administered 2019-08-26: 200 mg via INTRAVENOUS
  Filled 2019-08-26: qty 200

## 2019-08-26 MED ORDER — ACETAMINOPHEN 325 MG PO TABS
650.0000 mg | ORAL_TABLET | Freq: Once | ORAL | Status: AC
  Administered 2019-08-26: 650 mg via ORAL

## 2019-08-26 MED ORDER — SODIUM CHLORIDE 0.9 % IV SOLN
Freq: Once | INTRAVENOUS | Status: AC
  Filled 2019-08-26: qty 250

## 2019-08-26 MED ORDER — DIPHENHYDRAMINE HCL 25 MG PO CAPS
ORAL_CAPSULE | ORAL | Status: AC
  Filled 2019-08-26: qty 2

## 2019-08-26 MED ORDER — ACETAMINOPHEN 325 MG PO TABS
ORAL_TABLET | ORAL | Status: AC
  Filled 2019-08-26: qty 2

## 2019-08-27 ENCOUNTER — Ambulatory Visit: Payer: PRIVATE HEALTH INSURANCE

## 2019-08-27 ENCOUNTER — Ambulatory Visit (INDEPENDENT_AMBULATORY_CARE_PROVIDER_SITE_OTHER): Payer: PRIVATE HEALTH INSURANCE | Admitting: Allergy

## 2019-08-27 ENCOUNTER — Encounter: Payer: Self-pay | Admitting: Allergy

## 2019-08-27 VITALS — BP 120/70 | HR 58 | Temp 97.3°F | Resp 16 | Wt 172.8 lb

## 2019-08-27 DIAGNOSIS — J3089 Other allergic rhinitis: Secondary | ICD-10-CM | POA: Diagnosis not present

## 2019-08-27 DIAGNOSIS — J4520 Mild intermittent asthma, uncomplicated: Secondary | ICD-10-CM

## 2019-08-27 DIAGNOSIS — T781XXD Other adverse food reactions, not elsewhere classified, subsequent encounter: Secondary | ICD-10-CM | POA: Diagnosis not present

## 2019-08-27 DIAGNOSIS — K219 Gastro-esophageal reflux disease without esophagitis: Secondary | ICD-10-CM

## 2019-08-27 DIAGNOSIS — T7819XD Other adverse food reactions, not elsewhere classified, subsequent encounter: Secondary | ICD-10-CM

## 2019-08-27 DIAGNOSIS — H1013 Acute atopic conjunctivitis, bilateral: Secondary | ICD-10-CM

## 2019-08-27 MED ORDER — ALBUTEROL SULFATE HFA 108 (90 BASE) MCG/ACT IN AERS: 2.0000 | INHALATION_SPRAY | RESPIRATORY_TRACT | 3 refills | Status: DC | PRN

## 2019-08-27 MED ORDER — EPINEPHRINE 0.3 MG/0.3ML IJ SOAJ: INTRAMUSCULAR | 2 refills | Status: DC

## 2019-08-27 NOTE — Patient Instructions (Addendum)
Allergic rhinitis with conjunctivitis  - continue avoidance measures for grasses, trees, weeds, molds, dust mites, cat, dog, horse, cockroach, mouse.  - continue nasal saline rinse as neede.  Use distilled water or boil water and bring to room temp prior to use.  Use prior to using her medicated nasal sprays  - continue Astelin 2 sprays each nostril twice a day as needed to help with nasal drainage/post-nasal drip    - continue Flonase 2 sprays each nostril daily as needed for nasal congestion.  Use for 1-2 weeks at a time before stopping once symptoms improve.    - continue Xyzal 5mg  daily as needed   - continue Singulair 10 mg daily (if not meeting below goals)  - continue Pataday 1 drop each eye as needed for itchy, watery, red eyes  Intermittent asthma  - continue as needed use of albuterol inhaler  2 puffs or nebulizer 1 vial every 4-6 hours for cough, wheeze, difficulty breathing or chest tightness. Monitor frequency of use.   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   Would continue avoidance at this time   Reflux  - continue pantoprazole 40mg  daily  follow-up in 6 months or sooner if needed

## 2019-08-27 NOTE — Progress Notes (Signed)
Follow-up Note  RE: Lori Carson MRN: [id number] DOB: Jan 02, 1991 Date of Office Visit: 08/27/2019   History of present illness: Lori Carson is a 29 y.o. female presenting today for follow-up of allergic rhinitis with conjunctivitis, asthma, food allergies and reflux.  She was last seen in the office on 02/12/2019 myself.  She states she was started on iron infusions after last visit and has had 2 infusions thus far.  She has history of fibroids that leads to anemia.  She is planned to have a hysteroscopy upcoming. In regards to her allergies and pollen season she states she has been doing really well.  She has not had any increase in symptoms or need to use her nasal sprays at this time.  She will use Xyzal for allergy symptom control as well with Singulair which she states she has not been using these as she has not had any symptoms. In regards to her asthma she states that she does use albuterol prior to activity.  She states sometimes she may need to use it after the activity.  She states she had one episode where she had a cleaning people in her home cleaning and they were using their cleaning supplies for the first time and it did cause her to have some shortness of breath sensations and the need to use her albuterol then.  She also has used Asmanex in the past as well for maintenance.  She denies any ED or urgent care visits or systemic steroid needs.  She does wonder however if her degree of anemia has some effect on when she does feel short of breath. She has had positive testing to green pea in the past and recommended avoidance. She does continue to take pantoprazole for reflux control.  Review of systems: Review of Systems  Constitutional: Negative.   HENT: Negative.   Eyes: Negative.   Respiratory:       See HPI  Cardiovascular: Negative.   Gastrointestinal: Negative.   Musculoskeletal: Negative.   Skin: Negative.   Neurological: Negative.     All other systems  negative unless noted above in HPI  Past medical/social/surgical/family history have been reviewed and are unchanged unless specifically indicated below.  No changes  Medication List: Current Outpatient Medications  Medication Sig Dispense Refill  . albuterol (VENTOLIN HFA) 108 (90 Base) MCG/ACT inhaler Inhale 2 puffs into the lungs every 4 (four) hours as needed for wheezing or shortness of breath. 18 g 3  . azelastine (ASTELIN) 0.1 % nasal spray Place 2 sprays into both nostrils 2 (two) times daily. (Patient taking differently: Place 2 sprays into both nostrils 2 (two) times daily. As needed.) 30 mL 5  . EPINEPHrine (AUVI-Q) 0.3 mg/0.3 mL IJ SOAJ injection Use as directed for severe allergic reaction 2 Device 1  . fluticasone (FLONASE) 50 MCG/ACT nasal spray Place 2 sprays into both nostrils daily. As needed. 1 g 5  . levocetirizine (XYZAL) 5 MG tablet Take 1 tablet (5 mg total) by mouth every evening. (Patient taking differently: Take 5 mg by mouth every evening. ) 30 tablet 5  . mometasone (NASONEX) 50 MCG/ACT nasal spray Place 2 sprays into the nose daily as needed (congestion).    . Mometasone Furoate (ASMANEX HFA) 100 MCG/ACT AERO Inhale 2 puffs into the lungs 2 (two) times daily. 13 g 5  . montelukast (SINGULAIR) 10 MG tablet Take 1 tablet (10 mg total) by mouth at bedtime. (Patient taking differently: Take 10 mg by mouth at bedtime.  As needed.) 30 tablet 5  . ALPRAZolam (XANAX) 0.25 MG tablet Take 1 tablet by mouth 2 (two) times daily as needed.    . Cholecalciferol 1.25 MG (50000 UT) capsule Take 1 capsule by mouth daily.    . cyclobenzaprine (FLEXERIL) 10 MG tablet Take 1 tablet by mouth every 8 (eight) hours as needed.    . Elagolix Sodium (ORILISSA) 150 MG TABS Take 1 tablet by mouth daily.    Lori Carson & Lori Carson (Lori Carson) 300-1-0.5 & 300 MG CPPK Take 1 application by mouth See admin instructions. Lori Carson 300-1-0.5 mg(am)/300 mg (pm) capsules Take 1 capsule every day by  oral route.  Stop Orlissa and norethindrone when you start this medication.    Marland Kitchen HYDROmorphone (DILAUDID) 2 MG tablet Take 1 tablet by mouth every 4 (four) hours as needed.    Marland Kitchen ibuprofen (ADVIL,MOTRIN) 600 MG tablet 1  po pc every 6 hours for 5 days then prn-pain 30 tablet 1  . Mometasone Furoate (ASMANEX HFA) 100 MCG/ACT AERO Inhale 2 puffs into the lungs 2 (two) times a day. (Patient taking differently: Inhale 2 puffs into the lungs 2 (two) times a day. Using the sample given as needed.) 13 g 5  . Multiple Vitamins-Minerals (MULTIVITAMIN ADULT EXTRA C PO) multivitamin    . naproxen (NAPROSYN) 500 MG tablet naproxen 500 mg tablet  Take 1 tablet twice a day by oral route.    . norethindrone (AYGESTIN) 5 MG tablet Take 2 tablets by mouth daily. fibroids    . Olopatadine HCl (PATADAY) 0.2 % SOLN Place 1 drop into both eyes daily as needed. (Patient not taking: Reported on 08/27/2019) 1 Bottle 5  . pantoprazole (PROTONIX) 40 MG tablet Take 1 tablet (40 mg total) by mouth 2 (two) times daily. 60 tablet 5  . terconazole (TERAZOL 7) 0.4 % vaginal cream Place 1 applicator vaginally at bedtime as needed.     No current facility-administered medications for this visit.     Known medication allergies: Allergies  Allergen Reactions  . Oxycodone Itching     Physical examination: Blood pressure 120/70, pulse (!) 58, temperature (!) 97.3 F (36.3 C), temperature source Temporal, resp. rate 16, weight 172 lb 12.8 oz (78.4 kg), SpO2 99 %.  General: Alert, interactive, in no acute distress. HEENT: PERRLA, TMs pearly gray, turbinates non-edematous without discharge, post-pharynx non erythematous. Neck: Supple without lymphadenopathy. Lungs: Clear to auscultation without wheezing, rhonchi or rales. {no increased work of breathing. CV: Normal S1, S2 without murmurs. Abdomen: Nondistended, nontender. Skin: Warm and dry, without lesions or rashes. Extremities:  No clubbing, cyanosis or edema. Neuro:    Grossly intact.  Diagnositics/Labs: ACT - 21  Assessment and plan:   Allergic rhinitis with conjunctivitis  - continue avoidance measures for grasses, trees, weeds, molds, dust mites, cat, dog, horse, cockroach, mouse.  - continue nasal saline rinse as neede.  Use distilled water or boil water and bring to room temp prior to use.  Use prior to using her medicated nasal sprays  - continue Astelin 2 sprays each nostril twice a day as needed to help with nasal drainage/post-nasal drip    - continue Flonase 2 sprays each nostril daily as needed for nasal congestion.  Use for 1-2 weeks at a time before stopping once symptoms improve.    - continue Xyzal 5mg  daily as needed   - continue Singulair 10 mg daily (if not meeting below goals)  - continue Pataday 1 drop each eye as needed for itchy, watery,  red eyes  Intermittent asthma  - continue as needed use of albuterol inhaler  2 puffs or nebulizer 1 vial every 4-6 hours for cough, wheeze, difficulty breathing or chest tightness. Monitor frequency of use.   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   Would continue avoidance at this time   Reflux  - continue pantoprazole 40mg  daily  follow-up in 6 months or sooner if needed  I appreciate the opportunity to take part in Chairty's care. Please do not hesitate to contact me with questions.  Sincerely,   Prudy Feeler, MD Allergy/Immunology [facility]

## 2019-09-01 ENCOUNTER — Other Ambulatory Visit: Payer: Self-pay | Admitting: Obstetrics and Gynecology

## 2019-09-01 DIAGNOSIS — N83202 Unspecified ovarian cyst, left side: Secondary | ICD-10-CM

## 2019-09-03 ENCOUNTER — Other Ambulatory Visit: Payer: Self-pay

## 2019-09-03 ENCOUNTER — Inpatient Hospital Stay: Payer: PRIVATE HEALTH INSURANCE | Attending: Physician Assistant

## 2019-09-03 VITALS — BP 116/66 | HR 59 | Temp 100.5°F | Resp 18

## 2019-09-03 DIAGNOSIS — N920 Excessive and frequent menstruation with regular cycle: Secondary | ICD-10-CM | POA: Diagnosis not present

## 2019-09-03 DIAGNOSIS — D500 Iron deficiency anemia secondary to blood loss (chronic): Secondary | ICD-10-CM | POA: Insufficient documentation

## 2019-09-03 DIAGNOSIS — D259 Leiomyoma of uterus, unspecified: Secondary | ICD-10-CM | POA: Insufficient documentation

## 2019-09-03 MED ORDER — DIPHENHYDRAMINE HCL 25 MG PO CAPS
50.0000 mg | ORAL_CAPSULE | Freq: Once | ORAL | Status: AC
  Administered 2019-09-03: 50 mg via ORAL

## 2019-09-03 MED ORDER — ACETAMINOPHEN 325 MG PO TABS
ORAL_TABLET | ORAL | Status: AC
  Filled 2019-09-03: qty 2

## 2019-09-03 MED ORDER — SODIUM CHLORIDE 0.9 % IV SOLN
200.0000 mg | Freq: Once | INTRAVENOUS | Status: AC
  Administered 2019-09-03: 200 mg via INTRAVENOUS
  Filled 2019-09-03: qty 200

## 2019-09-03 MED ORDER — DIPHENHYDRAMINE HCL 25 MG PO CAPS
ORAL_CAPSULE | ORAL | Status: AC
  Filled 2019-09-03: qty 2

## 2019-09-03 MED ORDER — SODIUM CHLORIDE 0.9 % IV SOLN
Freq: Once | INTRAVENOUS | Status: AC
  Filled 2019-09-03: qty 250

## 2019-09-03 MED ORDER — ACETAMINOPHEN 325 MG PO TABS
650.0000 mg | ORAL_TABLET | Freq: Once | ORAL | Status: AC
  Administered 2019-09-03: 650 mg via ORAL

## 2019-09-03 NOTE — Progress Notes (Signed)
Temp 100.5 post iron infusion. Pt states "I feel fine" V. Tanner PA notified. Pt ok to be discharged home. She can take tylenol in 4 hours if needed.

## 2019-09-03 NOTE — Patient Instructions (Signed)

## 2019-09-07 ENCOUNTER — Ambulatory Visit
Admission: RE | Admit: 2019-09-07 | Discharge: 2019-09-07 | Disposition: A | Discharge status: Home / Self Care | Payer: PRIVATE HEALTH INSURANCE | Source: Ambulatory Visit | Attending: Obstetrics and Gynecology | Admitting: Obstetrics and Gynecology

## 2019-09-07 DIAGNOSIS — N83202 Unspecified ovarian cyst, left side: Secondary | ICD-10-CM

## 2019-09-10 ENCOUNTER — Inpatient Hospital Stay: Payer: PRIVATE HEALTH INSURANCE

## 2019-09-10 ENCOUNTER — Other Ambulatory Visit: Payer: Self-pay | Admitting: Obstetrics and Gynecology

## 2019-09-10 ENCOUNTER — Other Ambulatory Visit: Payer: Self-pay

## 2019-09-10 VITALS — BP 116/76 | HR 47 | Temp 98.3°F | Resp 17

## 2019-09-10 DIAGNOSIS — D500 Iron deficiency anemia secondary to blood loss (chronic): Secondary | ICD-10-CM | POA: Diagnosis not present

## 2019-09-10 MED ORDER — SODIUM CHLORIDE 0.9 % IV SOLN
200.0000 mg | Freq: Once | INTRAVENOUS | Status: AC
  Administered 2019-09-10: 200 mg via INTRAVENOUS
  Filled 2019-09-10: qty 10

## 2019-09-10 MED ORDER — DIPHENHYDRAMINE HCL 25 MG PO CAPS
50.0000 mg | ORAL_CAPSULE | Freq: Once | ORAL | Status: AC
  Administered 2019-09-10: 50 mg via ORAL

## 2019-09-10 MED ORDER — ACETAMINOPHEN 325 MG PO TABS
ORAL_TABLET | ORAL | Status: AC
  Filled 2019-09-10: qty 2

## 2019-09-10 MED ORDER — ACETAMINOPHEN 325 MG PO TABS
650.0000 mg | ORAL_TABLET | Freq: Once | ORAL | Status: AC
  Administered 2019-09-10: 650 mg via ORAL

## 2019-09-10 MED ORDER — SODIUM CHLORIDE 0.9 % IV SOLN
Freq: Once | INTRAVENOUS | Status: AC
  Filled 2019-09-10: qty 250

## 2019-09-10 MED ORDER — DIPHENHYDRAMINE HCL 25 MG PO CAPS
ORAL_CAPSULE | ORAL | Status: AC
  Filled 2019-09-10: qty 2

## 2019-09-10 NOTE — Patient Instructions (Signed)

## 2019-09-30 ENCOUNTER — Inpatient Hospital Stay: Payer: PRIVATE HEALTH INSURANCE

## 2019-09-30 ENCOUNTER — Other Ambulatory Visit: Payer: Self-pay

## 2019-09-30 ENCOUNTER — Inpatient Hospital Stay: Payer: PRIVATE HEALTH INSURANCE | Attending: Physician Assistant | Admitting: Internal Medicine

## 2019-09-30 ENCOUNTER — Encounter: Payer: Self-pay | Admitting: Internal Medicine

## 2019-09-30 VITALS — BP 133/88 | HR 56 | Temp 98.1°F | Resp 17 | Ht 67.0 in | Wt 178.1 lb

## 2019-09-30 DIAGNOSIS — D500 Iron deficiency anemia secondary to blood loss (chronic): Secondary | ICD-10-CM

## 2019-09-30 DIAGNOSIS — N920 Excessive and frequent menstruation with regular cycle: Secondary | ICD-10-CM | POA: Insufficient documentation

## 2019-09-30 LAB — CBC WITH DIFFERENTIAL (CANCER CENTER ONLY)
Abs Immature Granulocytes: 0.01 10*3/uL (ref 0.00–0.07)
Basophils Absolute: 0 10*3/uL (ref 0.0–0.1)
Basophils Relative: 0 %
Eosinophils Absolute: 0.2 10*3/uL (ref 0.0–0.5)
Eosinophils Relative: 5 %
HCT: 36.9 % (ref 36.0–46.0)
Hemoglobin: 11.1 g/dL — ABNORMAL LOW (ref 12.0–15.0)
Immature Granulocytes: 0 %
Lymphocytes Relative: 40 %
Lymphs Abs: 1.9 10*3/uL (ref 0.7–4.0)
MCH: 24 pg — ABNORMAL LOW (ref 26.0–34.0)
MCHC: 30.1 g/dL (ref 30.0–36.0)
MCV: 79.9 fL — ABNORMAL LOW (ref 80.0–100.0)
Monocytes Absolute: 0.4 10*3/uL (ref 0.1–1.0)
Monocytes Relative: 8 %
Neutro Abs: 2.2 10*3/uL (ref 1.7–7.7)
Neutrophils Relative %: 47 %
Platelet Count: 291 10*3/uL (ref 150–400)
RBC: 4.62 MIL/uL (ref 3.87–5.11)
RDW: 24.1 % — ABNORMAL HIGH (ref 11.5–15.5)
WBC Count: 4.8 10*3/uL (ref 4.0–10.5)
nRBC: 0 % (ref 0.0–0.2)

## 2019-09-30 LAB — IRON AND TIBC
Iron: 30 ug/dL — ABNORMAL LOW (ref 41–142)
Saturation Ratios: 7 % — ABNORMAL LOW (ref 21–57)
TIBC: 436 ug/dL (ref 236–444)
UIBC: 406 ug/dL — ABNORMAL HIGH (ref 120–384)

## 2019-09-30 LAB — FERRITIN: Ferritin: 15 ng/mL (ref 11–307)

## 2019-09-30 NOTE — Progress Notes (Signed)
[facility] [phone]   [phone]  OFFICE PROGRESS NOTE  Patient, No Pcp Per No address on file  DIAGNOSIS: Iron deficiency anemia secondary to menorrhagia.  PRIOR THERAPY: Venofer infusion for 4 weeks started on 08/20/2019.  CURRENT THERAPY: Over-the-counter oral iron supplements 2 times daily.  INTERVAL HISTORY: Lori Carson 29 y.o. female returns to the clinic today for follow-up visit.  The patient is feeling fine today with no concerning complaints except for mild fatigue and still have some occasional dizzy spells.  She felt much better after receiving the iron infusion.  She continues to have menorrhagia with persistent vaginal bleeding.  She was seen by her gynecologist has an intervention on Sep 10, 2019 but she has no improvement in her condition.  She is expected to increase her dose of norethindrone to 15 mg daily. She is currently on oral iron tablet twice daily.  She denied having any chest pain, shortness of breath, cough or hemoptysis.  She denied having any fever or chills.  The patient has no recent weight loss or night sweats.  She has no nausea, vomiting, diarrhea or constipation.  She is here today for evaluation with repeat CBC, iron study and ferritin.  MEDICAL HISTORY: Past Medical History:  Diagnosis Date  . Anemia    borderline  . Asthma   . Chest pain    left arm numbness but can take tums and goes away  . Fibroids   . GERD (gastroesophageal reflux disease)     ALLERGIES:  is allergic to oxycodone.  MEDICATIONS:  Current Outpatient Medications  Medication Sig Dispense Refill  . albuterol (VENTOLIN HFA) 108 (90 Base) MCG/ACT inhaler Inhale 2 puffs into the lungs every 4 (four) hours as needed for wheezing or shortness of breath. 18 g 3  . ALPRAZolam (XANAX) 0.25 MG tablet Take 1 tablet by mouth 2 (two) times daily as needed.    Marland Kitchen azelastine (ASTELIN) 0.1 % nasal spray Place 2 sprays into both nostrils 2 (two)  times daily. (Patient taking differently: Place 2 sprays into both nostrils 2 (two) times daily. As needed.) 30 mL 5  . Cholecalciferol 1.25 MG (50000 UT) capsule Take 1 capsule by mouth daily.    . cyclobenzaprine (FLEXERIL) 10 MG tablet Take 1 tablet by mouth every 8 (eight) hours as needed.    Marland Kitchen EPINEPHrine (AUVI-Q) 0.3 mg/0.3 mL IJ SOAJ injection Use as directed for severe allergic reaction 2 each 2  . fluticasone (FLONASE) 50 MCG/ACT nasal spray Place 2 sprays into both nostrils daily. As needed. 1 g 5  . HYDROmorphone (DILAUDID) 2 MG tablet Take 1 tablet by mouth every 4 (four) hours as needed.    Marland Kitchen ibuprofen (ADVIL,MOTRIN) 600 MG tablet 1  po pc every 6 hours for 5 days then prn-pain 30 tablet 1  . levocetirizine (XYZAL) 5 MG tablet Take 1 tablet (5 mg total) by mouth every evening. (Patient taking differently: Take 5 mg by mouth every evening. ) 30 tablet 5  . mometasone (NASONEX) 50 MCG/ACT nasal spray Place 2 sprays into the nose daily as needed (congestion).    . Mometasone Furoate (ASMANEX HFA) 100 MCG/ACT AERO Inhale 2 puffs into the lungs 2 (two) times a day. (Patient taking differently: Inhale 2 puffs into the lungs 2 (two) times a day. Using the sample given as needed.) 13 g 5  . Mometasone Furoate (ASMANEX HFA) 100 MCG/ACT AERO Inhale 2 puffs into the lungs 2 (two) times  daily. 13 g 5  . montelukast (SINGULAIR) 10 MG tablet Take 1 tablet (10 mg total) by mouth at bedtime. (Patient taking differently: Take 10 mg by mouth at bedtime. As needed.) 30 tablet 5  . Multiple Vitamins-Minerals (MULTIVITAMIN ADULT EXTRA C PO) multivitamin    . naproxen (NAPROSYN) 500 MG tablet naproxen 500 mg tablet  Take 1 tablet twice a day by oral route.    . norethindrone (AYGESTIN) 5 MG tablet Take 2 tablets by mouth daily. fibroids    . Olopatadine HCl (PATADAY) 0.2 % SOLN Place 1 drop into both eyes daily as needed. (Patient not taking: Reported on 08/27/2019) 1 Bottle 5  . pantoprazole (PROTONIX) 40 MG  tablet Take 1 tablet (40 mg total) by mouth 2 (two) times daily. 60 tablet 5  . terconazole (TERAZOL 7) 0.4 % vaginal cream Place 1 applicator vaginally at bedtime as needed.     No current facility-administered medications for this visit.    SURGICAL HISTORY:  Past Surgical History:  Procedure Laterality Date  . MYOMECTOMY N/A 07/08/2016   Procedure: MYOMECTOMY with peritoneal biopsy;  Surgeon: Everett Graff, MD;  Location: [facility];  Service: Gynecology;  Laterality: N/A;  . OVARIAN CYST REMOVAL N/A 07/08/2016   Procedure: OVARIAN CYSTECTOMY right, left paratubal cystectomy;  Surgeon: Everett Graff, MD;  Location: [facility];  Service: Gynecology;  Laterality: N/A;  . TONSILLECTOMY    . UPPER GI ENDOSCOPY      REVIEW OF SYSTEMS:  A comprehensive review of systems was negative except for: Constitutional: positive for fatigue Neurological: positive for dizziness   PHYSICAL EXAMINATION: General appearance: alert, cooperative, fatigued and no distress Head: Normocephalic, without obvious abnormality, atraumatic Neck: no adenopathy, no JVD, supple, symmetrical, trachea midline and thyroid not enlarged, symmetric, no tenderness/mass/nodules Lymph nodes: Cervical, supraclavicular, and axillary nodes normal. Resp: clear to auscultation bilaterally Back: symmetric, no curvature. ROM normal. No CVA tenderness. Cardio: regular rate and rhythm, S1, S2 normal, no murmur, click, rub or gallop GI: soft, non-tender; bowel sounds normal; no masses,  no organomegaly Extremities: extremities normal, atraumatic, no cyanosis or edema  ECOG PERFORMANCE STATUS: 1 - Symptomatic but completely ambulatory  Blood pressure 133/88, pulse (!) 56, temperature 98.1 F (36.7 C), temperature source Temporal, resp. rate 17, height 5\' 7"  (1.702 m), weight 178 lb 1.6 oz (80.8 kg), SpO2 100 %.  LABORATORY DATA: Lab Results  Component Value Date   WBC 4.8 09/30/2019   HGB 11.1 (L) 09/30/2019   HCT 36.9 09/30/2019    MCV 79.9 (L) 09/30/2019   PLT 291 09/30/2019      Chemistry      Component Value Date/Time   NA 138 08/17/2019 1315   K 3.5 08/17/2019 1315   CL 103 08/17/2019 1315   CO2 24 08/17/2019 1315   BUN 6 08/17/2019 1315   CREATININE 0.85 08/17/2019 1315      Component Value Date/Time   CALCIUM 8.8 (L) 08/17/2019 1315   ALKPHOS 53 08/17/2019 1315   AST 15 08/17/2019 1315   ALT 8 08/17/2019 1315   BILITOT 0.4 08/17/2019 1315       RADIOGRAPHIC STUDIES: US PELVIC COMPLETE WITH TRANSVAGINAL  Result Date: 09/07/2019 CLINICAL DATA:  Follow-up left ovarian cyst EXAM: TRANSABDOMINAL AND TRANSVAGINAL ULTRASOUND OF PELVIS TECHNIQUE: Both transabdominal and transvaginal ultrasound examinations of the pelvis were performed. Transabdominal technique was performed for global imaging of the pelvis including uterus, ovaries, adnexal regions, and pelvic cul-de-sac. It was necessary to proceed with endovaginal exam following the  transabdominal exam to visualize the endometrium. COMPARISON:  07/13/2019 FINDINGS: Uterus Measurements: 10.3 x 5.6 x 6.0 cm = volume: 173 mL. Several small fibroids, the largest 2.9 cm in the anterior and posterior fundus. Endometrium Thickness: 8 mm in thickness.  No focal abnormality visualized. Right ovary Measurements: 4.2 x 3.0 x 3.6 cm = volume: 23 mL. 5 cm cyst. No internal blood flow ir septations. Left ovary Measurements: 4.2 x 1.1 x 1.4 cm = volume: 3.5 mL. Normal appearance/no adnexal mass. Other findings Trace free fluid. IMPRESSION: 5 cm simple appearing right ovarian cyst. This is new since prior study and likely functional cyst. This has benign characteristics. No imaging follow up is required for premenopausal females. This follows consensus guidelines: Simple Adnexal Cysts: SRU Consensus Conference Update on Follow-up and Reporting. Radiology 2019; [id number]. Resolution of previously seen left ovarian cyst. Fibroid uterus. Electronically Signed   By: Rolm Baptise M.D.    On: 09/07/2019 19:50    ASSESSMENT AND PLAN: This is a very pleasant 29 years old African-American female with persistent iron deficiency anemia secondary to chronic blood loss from menorrhagia.  The patient received iron infusion with Venofer for 4 doses and she felt much better but continues to have mild fatigue. The patient is currently on oral iron tablets twice daily and tolerating it well. CBC today showed improvement in her hemoglobin and hematocrit but she continues to have mild anemia.  Iron study and ferritin are still pending. I recommended for the patient to continue on the oral iron tablets for now. If the pending iron study showed severe iron deficiency, I may consider the patient for iron infusion again. She will come back for follow-up visit in 3 months for evaluation with repeat CBC, iron study and ferritin.  She was also advised to call earlier if she has any concerning complaints or fatigue in the interval. The patient voices understanding of current disease status and treatment options and is in agreement with the current care plan.  All questions were answered. The patient knows to call the clinic with any problems, questions or concerns. We can certainly see the patient much sooner if necessary.  Disclaimer: This note was dictated with voice recognition software. Similar sounding words can inadvertently be transcribed and may not be corrected upon review.

## 2019-10-01 ENCOUNTER — Other Ambulatory Visit: Payer: Self-pay | Admitting: Obstetrics & Gynecology

## 2019-10-21 ENCOUNTER — Telehealth: Payer: Self-pay | Admitting: *Deleted

## 2019-10-21 ENCOUNTER — Telehealth: Payer: Self-pay | Admitting: Physician Assistant

## 2019-10-21 ENCOUNTER — Other Ambulatory Visit: Payer: Self-pay | Admitting: Physician Assistant

## 2019-10-21 NOTE — Telephone Encounter (Signed)
Received vm call today from yesterday from [place] office stating that Dr recommends that pt receive IV iron.  Hgb 10.5.  She states that she has a sonohistogram scheduled & [place] has been done.  She states lab was faxed but if Dr Julien Nordmann needs anything else to call her.  Message to DR Mohamed/Pod

## 2019-10-21 NOTE — Telephone Encounter (Signed)
Scheduled appt per 6/24 sch message - pt is aware of appt date and time   

## 2019-10-22 ENCOUNTER — Other Ambulatory Visit: Payer: Self-pay | Admitting: Obstetrics & Gynecology

## 2019-10-22 DIAGNOSIS — D219 Benign neoplasm of connective and other soft tissue, unspecified: Secondary | ICD-10-CM

## 2019-10-29 ENCOUNTER — Inpatient Hospital Stay: Payer: PRIVATE HEALTH INSURANCE | Attending: Physician Assistant

## 2019-10-29 ENCOUNTER — Other Ambulatory Visit: Payer: Self-pay

## 2019-10-29 VITALS — BP 115/74 | HR 66 | Temp 98.3°F | Resp 16

## 2019-10-29 DIAGNOSIS — D500 Iron deficiency anemia secondary to blood loss (chronic): Secondary | ICD-10-CM

## 2019-10-29 DIAGNOSIS — N920 Excessive and frequent menstruation with regular cycle: Secondary | ICD-10-CM | POA: Diagnosis present

## 2019-10-29 MED ORDER — SODIUM CHLORIDE 0.9 % IV SOLN
INTRAVENOUS | Status: DC
  Filled 2019-10-29 (×2): qty 250

## 2019-10-29 MED ORDER — DIPHENHYDRAMINE HCL 25 MG PO CAPS
ORAL_CAPSULE | ORAL | Status: AC
  Filled 2019-10-29: qty 2

## 2019-10-29 MED ORDER — DIPHENHYDRAMINE HCL 25 MG PO CAPS
50.0000 mg | ORAL_CAPSULE | Freq: Once | ORAL | Status: AC
  Administered 2019-10-29: 50 mg via ORAL

## 2019-10-29 MED ORDER — ACETAMINOPHEN 325 MG PO TABS
650.0000 mg | ORAL_TABLET | Freq: Once | ORAL | Status: AC
  Administered 2019-10-29: 650 mg via ORAL

## 2019-10-29 MED ORDER — SODIUM CHLORIDE 0.9 % IV SOLN
200.0000 mg | Freq: Once | INTRAVENOUS | Status: AC
  Administered 2019-10-29: 200 mg via INTRAVENOUS
  Filled 2019-10-29: qty 200

## 2019-10-29 MED ORDER — ACETAMINOPHEN 325 MG PO TABS
ORAL_TABLET | ORAL | Status: AC
  Filled 2019-10-29: qty 2

## 2019-10-29 NOTE — Patient Instructions (Signed)

## 2019-11-04 ENCOUNTER — Inpatient Hospital Stay: Payer: PRIVATE HEALTH INSURANCE

## 2019-11-04 ENCOUNTER — Other Ambulatory Visit: Payer: Self-pay

## 2019-11-04 VITALS — BP 140/85 | HR 64 | Temp 98.4°F | Resp 18

## 2019-11-04 DIAGNOSIS — D500 Iron deficiency anemia secondary to blood loss (chronic): Secondary | ICD-10-CM | POA: Diagnosis not present

## 2019-11-04 MED ORDER — SODIUM CHLORIDE 0.9 % IV SOLN
200.0000 mg | Freq: Once | INTRAVENOUS | Status: AC
  Administered 2019-11-04: 200 mg via INTRAVENOUS
  Filled 2019-11-04: qty 200

## 2019-11-04 MED ORDER — DIPHENHYDRAMINE HCL 25 MG PO CAPS
ORAL_CAPSULE | ORAL | Status: AC
  Filled 2019-11-04: qty 2

## 2019-11-04 MED ORDER — ACETAMINOPHEN 325 MG PO TABS
650.0000 mg | ORAL_TABLET | Freq: Once | ORAL | Status: AC
  Administered 2019-11-04: 650 mg via ORAL

## 2019-11-04 MED ORDER — ACETAMINOPHEN 325 MG PO TABS
ORAL_TABLET | ORAL | Status: AC
  Filled 2019-11-04: qty 2

## 2019-11-04 MED ORDER — DIPHENHYDRAMINE HCL 25 MG PO CAPS
50.0000 mg | ORAL_CAPSULE | Freq: Once | ORAL | Status: AC
  Administered 2019-11-04: 50 mg via ORAL

## 2019-11-04 MED ORDER — SODIUM CHLORIDE 0.9 % IV SOLN
INTRAVENOUS | Status: DC
  Filled 2019-11-04: qty 250

## 2019-11-04 NOTE — Progress Notes (Signed)
Patient declined 30 minute post-observation period. VSS upon discharge.

## 2019-11-04 NOTE — Patient Instructions (Signed)

## 2019-11-11 ENCOUNTER — Ambulatory Visit: Payer: PRIVATE HEALTH INSURANCE | Attending: Internal Medicine

## 2019-11-11 DIAGNOSIS — Z20822 Contact with and (suspected) exposure to covid-19: Secondary | ICD-10-CM

## 2019-11-12 ENCOUNTER — Inpatient Hospital Stay: Payer: PRIVATE HEALTH INSURANCE

## 2019-11-12 LAB — SARS-COV-2, NAA 2 DAY TAT

## 2019-11-12 LAB — NOVEL CORONAVIRUS, NAA: SARS-CoV-2, NAA: NOT DETECTED

## 2019-11-18 ENCOUNTER — Other Ambulatory Visit: Payer: PRIVATE HEALTH INSURANCE

## 2019-11-19 ENCOUNTER — Inpatient Hospital Stay: Payer: PRIVATE HEALTH INSURANCE

## 2019-11-19 ENCOUNTER — Other Ambulatory Visit: Payer: Self-pay

## 2019-11-19 VITALS — BP 122/80 | HR 55 | Temp 98.0°F | Resp 18

## 2019-11-19 DIAGNOSIS — D500 Iron deficiency anemia secondary to blood loss (chronic): Secondary | ICD-10-CM

## 2019-11-19 MED ORDER — SODIUM CHLORIDE 0.9 % IV SOLN
Freq: Once | INTRAVENOUS | Status: AC
  Filled 2019-11-19: qty 250

## 2019-11-19 MED ORDER — DIPHENHYDRAMINE HCL 25 MG PO CAPS
ORAL_CAPSULE | ORAL | Status: AC
  Filled 2019-11-19: qty 2

## 2019-11-19 MED ORDER — DIPHENHYDRAMINE HCL 25 MG PO CAPS
50.0000 mg | ORAL_CAPSULE | Freq: Once | ORAL | Status: AC
  Administered 2019-11-19: 50 mg via ORAL

## 2019-11-19 MED ORDER — SODIUM CHLORIDE 0.9 % IV SOLN
200.0000 mg | Freq: Once | INTRAVENOUS | Status: AC
  Administered 2019-11-19: 200 mg via INTRAVENOUS
  Filled 2019-11-19: qty 200

## 2019-11-19 MED ORDER — ACETAMINOPHEN 325 MG PO TABS
ORAL_TABLET | ORAL | Status: AC
  Filled 2019-11-19: qty 2

## 2019-11-19 MED ORDER — ACETAMINOPHEN 325 MG PO TABS
650.0000 mg | ORAL_TABLET | Freq: Once | ORAL | Status: AC
  Administered 2019-11-19: 650 mg via ORAL

## 2019-11-19 NOTE — Patient Instructions (Signed)

## 2019-11-19 NOTE — Progress Notes (Signed)
Patient declined to stay for 30 minute post observation period. VSS upon leaving infusion room.  

## 2019-11-26 ENCOUNTER — Other Ambulatory Visit: Payer: Self-pay

## 2019-11-26 ENCOUNTER — Inpatient Hospital Stay: Payer: PRIVATE HEALTH INSURANCE

## 2019-11-26 VITALS — BP 119/74 | HR 68 | Temp 99.1°F | Resp 18 | Ht 67.0 in

## 2019-11-26 DIAGNOSIS — D500 Iron deficiency anemia secondary to blood loss (chronic): Secondary | ICD-10-CM | POA: Diagnosis not present

## 2019-11-26 MED ORDER — SODIUM CHLORIDE 0.9 % IV SOLN
200.0000 mg | Freq: Once | INTRAVENOUS | Status: AC
  Administered 2019-11-26: 200 mg via INTRAVENOUS
  Filled 2019-11-26: qty 200

## 2019-11-26 MED ORDER — ACETAMINOPHEN 325 MG PO TABS
ORAL_TABLET | ORAL | Status: AC
  Filled 2019-11-26: qty 2

## 2019-11-26 MED ORDER — ACETAMINOPHEN 325 MG PO TABS
650.0000 mg | ORAL_TABLET | Freq: Once | ORAL | Status: AC
  Administered 2019-11-26: 650 mg via ORAL

## 2019-11-26 MED ORDER — DIPHENHYDRAMINE HCL 25 MG PO CAPS
50.0000 mg | ORAL_CAPSULE | Freq: Once | ORAL | Status: AC
  Administered 2019-11-26: 50 mg via ORAL

## 2019-11-26 MED ORDER — SODIUM CHLORIDE 0.9 % IV SOLN
Freq: Once | INTRAVENOUS | Status: AC
  Filled 2019-11-26: qty 250

## 2019-11-26 MED ORDER — DIPHENHYDRAMINE HCL 25 MG PO CAPS
ORAL_CAPSULE | ORAL | Status: AC
  Filled 2019-11-26: qty 2

## 2019-11-26 NOTE — Patient Instructions (Signed)

## 2019-11-26 NOTE — Progress Notes (Signed)
Pt refused to stay 30 minutes post observation of IV Venofer.  VSS and pt discharged home.

## 2019-12-10 ENCOUNTER — Other Ambulatory Visit: Payer: Self-pay | Admitting: Obstetrics & Gynecology

## 2019-12-20 NOTE — Progress Notes (Signed)
[facility] (New Address) - [place], [address] Previously: [address] [place] [address] Phone: [phone] Fax: [facility] [place], [place] - [place] [address] Phone: [phone] Fax: [phone]      Your procedure is scheduled on August 26  Report to [facility] Main Entrance "A" at 1130 A.M., and check in at the Admitting office.  Call this number if you have problems the morning of surgery:  [phone]  Call [phone] if you have any questions prior to your surgery date Monday-Friday 8am-4pm    Remember:  Do not eat or drink after midnight the night before your surgery     Take these medicines the morning of surgery with A SIP OF WATER  albuterol (VENTOLIN HFA), if needed Please bring all inhalers with you the day of surgery.  azelastine (ASTELIN) if needed estradiol (ESTRACE) fluticasone (FLONASE)  HYDROmorphone (DILAUDID if needed Mometasone Furoate [facility] HFA) pantoprazole (PROTONIX) Eye drops if needed   As of today, STOP taking any Aspirin (unless otherwise instructed by your surgeon) Aleve, Naproxen, Ibuprofen, Motrin, Advil, Goody's, BC's, all herbal medications, fish oil, and all vitamins.                      Do not wear jewelry, make up, or nail polish            Do not wear lotions, powders, perfumes/colognes, or deodorant.            Do not shave 48 hours prior to surgery.              Do not bring valuables to the hospital.            [facility] is not responsible for any belongings or valuables.  Do NOT Smoke (Tobacco/Vaping) or drink Alcohol 24 hours prior to your procedure If you use a CPAP at night, you may bring all equipment for your overnight stay.   Contacts, glasses, dentures or bridgework may not be worn into surgery.      For patients  admitted to the hospital, discharge time will be determined by your treatment team.   Patients discharged the day of surgery will not be allowed to drive home, and someone needs to stay with them for 24 hours.    Special instructions:   [facility]- Preparing For Surgery  Before surgery, you can play an important role. Because skin is not sterile, your skin needs to be as free of germs as possible. You can reduce the number of germs on your skin by washing with CHG (chlorahexidine gluconate) Soap before surgery.  CHG is an antiseptic cleaner which kills germs and bonds with the skin to continue killing germs even after washing.    Oral Hygiene is also important to reduce your risk of infection.  Remember - BRUSH YOUR TEETH THE MORNING OF SURGERY WITH YOUR REGULAR TOOTHPASTE  Please do not use if you have an allergy to CHG or antibacterial soaps. If your skin becomes reddened/irritated stop using the CHG.  Do not shave (including legs and underarms) for at least 48 hours prior to first CHG shower. It is OK to shave your face.  Please follow these instructions carefully.   1. Shower the NIGHT BEFORE SURGERY and the MORNING OF SURGERY with CHG Soap.   2. If  you chose to wash your hair, wash your hair first as usual with your normal shampoo.  3. After you shampoo, rinse your hair and body thoroughly to remove the shampoo.  4. Use CHG as you would any other liquid soap. You can apply CHG directly to the skin and wash gently with a scrungie or a clean washcloth.   5. Apply the CHG Soap to your body ONLY FROM THE NECK DOWN.  Do not use on open wounds or open sores. Avoid contact with your eyes, ears, mouth and genitals (private parts). Wash Face and genitals (private parts)  with your normal soap.   6. Wash thoroughly, paying special attention to the area where your surgery will be performed.  7. Thoroughly rinse your body with warm water from the neck down.  8. DO NOT shower/wash with your  normal soap after using and rinsing off the CHG Soap.  9. Pat yourself dry with a CLEAN TOWEL.  10. Wear CLEAN PAJAMAS to bed the night before surgery  11. Place CLEAN SHEETS on your bed the night of your first shower and DO NOT SLEEP WITH PETS.   Day of Surgery: Wear Clean/Comfortable clothing the morning of surgery Do not apply any deodorants/lotions.   Remember to brush your teeth WITH YOUR REGULAR TOOTHPASTE.   Please read over the following fact sheets that you were given.

## 2019-12-21 ENCOUNTER — Encounter (HOSPITAL_COMMUNITY)
Admission: RE | Admit: 2019-12-21 | Discharge: 2019-12-21 | Disposition: A | Discharge status: Home / Self Care | Payer: PRIVATE HEALTH INSURANCE | Source: Ambulatory Visit | Attending: Obstetrics & Gynecology | Admitting: Obstetrics & Gynecology

## 2019-12-21 ENCOUNTER — Other Ambulatory Visit: Payer: Self-pay

## 2019-12-21 ENCOUNTER — Other Ambulatory Visit (HOSPITAL_COMMUNITY)
Admission: RE | Admit: 2019-12-21 | Discharge: 2019-12-21 | Disposition: A | Discharge status: Home / Self Care | Payer: PRIVATE HEALTH INSURANCE | Source: Ambulatory Visit | Attending: Obstetrics & Gynecology | Admitting: Obstetrics & Gynecology

## 2019-12-21 ENCOUNTER — Encounter (HOSPITAL_COMMUNITY): Payer: Self-pay

## 2019-12-21 DIAGNOSIS — Z20822 Contact with and (suspected) exposure to covid-19: Secondary | ICD-10-CM | POA: Insufficient documentation

## 2019-12-21 DIAGNOSIS — Z01812 Encounter for preprocedural laboratory examination: Secondary | ICD-10-CM | POA: Insufficient documentation

## 2019-12-21 LAB — TYPE AND SCREEN
ABO/RH(D): B POS
Antibody Screen: NEGATIVE

## 2019-12-21 LAB — BASIC METABOLIC PANEL
Anion gap: 10 (ref 5–15)
BUN: 6 mg/dL (ref 6–20)
CO2: 21 mmol/L — ABNORMAL LOW (ref 22–32)
Calcium: 8.8 mg/dL — ABNORMAL LOW (ref 8.9–10.3)
Chloride: 110 mmol/L (ref 98–111)
Creatinine, Ser: 0.89 mg/dL (ref 0.44–1.00)
GFR calc Af Amer: >60 mL/min (ref >60)
GFR calc non Af Amer: >60 mL/min (ref >60)
Glucose, Bld: 87 mg/dL (ref 70–99)
Potassium: 3.6 mmol/L (ref 3.5–5.1)
Sodium: 141 mmol/L (ref 135–145)

## 2019-12-21 LAB — CBC
HCT: 39.3 % (ref 36.0–46.0)
Hemoglobin: 12.2 g/dL (ref 12.0–15.0)
MCH: 27.2 pg (ref 26.0–34.0)
MCHC: 31 g/dL (ref 30.0–36.0)
MCV: 87.7 fL (ref 80.0–100.0)
Platelets: 268 10*3/uL (ref 150–400)
RBC: 4.48 MIL/uL (ref 3.87–5.11)
RDW: 16.5 % — ABNORMAL HIGH (ref 11.5–15.5)
WBC: 4.8 10*3/uL (ref 4.0–10.5)
nRBC: 0 % (ref 0.0–0.2)

## 2019-12-21 LAB — SARS CORONAVIRUS 2 (TAT 6-24 HRS): SARS Coronavirus 2: NEGATIVE

## 2019-12-21 NOTE — Progress Notes (Signed)
PCP - no PCP  Chest x-ray - n/a EKG - n/a Stress Test - denies ECHO - denies Cardiac Cath - denies  COVID TEST- 12/21/19    Anesthesia review: NO  Patient denies shortness of breath, fever, cough and chest pain at PAT appointment   All instructions explained to the patient, with a verbal understanding of the material. Patient agrees to go over the instructions while at home for a better understanding. Patient also instructed to self quarantine after being tested for COVID-19. The opportunity to ask questions was provided.

## 2019-12-23 ENCOUNTER — Inpatient Hospital Stay (HOSPITAL_COMMUNITY)
Admission: RE | Admit: 2019-12-23 | Discharge: 2019-12-25 | DRG: 743 | Disposition: A | Discharge status: Home / Self Care | Payer: PRIVATE HEALTH INSURANCE | Attending: Obstetrics & Gynecology | Admitting: Obstetrics & Gynecology

## 2019-12-23 ENCOUNTER — Encounter (HOSPITAL_COMMUNITY): Payer: Self-pay | Admitting: Obstetrics & Gynecology

## 2019-12-23 ENCOUNTER — Other Ambulatory Visit: Payer: Self-pay

## 2019-12-23 ENCOUNTER — Encounter (HOSPITAL_COMMUNITY)
Admission: RE | Disposition: A | Discharge status: Home / Self Care | Payer: Self-pay | Source: Home / Self Care | Attending: Obstetrics & Gynecology

## 2019-12-23 ENCOUNTER — Inpatient Hospital Stay (HOSPITAL_COMMUNITY): Payer: PRIVATE HEALTH INSURANCE | Admitting: Anesthesiology

## 2019-12-23 DIAGNOSIS — N736 Female pelvic peritoneal adhesions (postinfective): Secondary | ICD-10-CM | POA: Diagnosis present

## 2019-12-23 DIAGNOSIS — Z885 Allergy status to narcotic agent status: Secondary | ICD-10-CM

## 2019-12-23 DIAGNOSIS — Z79899 Other long term (current) drug therapy: Secondary | ICD-10-CM

## 2019-12-23 DIAGNOSIS — N920 Excessive and frequent menstruation with regular cycle: Secondary | ICD-10-CM | POA: Diagnosis present

## 2019-12-23 DIAGNOSIS — Z825 Family history of asthma and other chronic lower respiratory diseases: Secondary | ICD-10-CM

## 2019-12-23 DIAGNOSIS — K660 Peritoneal adhesions (postprocedural) (postinfection): Secondary | ICD-10-CM | POA: Diagnosis present

## 2019-12-23 DIAGNOSIS — D250 Submucous leiomyoma of uterus: Secondary | ICD-10-CM | POA: Diagnosis present

## 2019-12-23 DIAGNOSIS — D251 Intramural leiomyoma of uterus: Principal | ICD-10-CM | POA: Diagnosis present

## 2019-12-23 DIAGNOSIS — K219 Gastro-esophageal reflux disease without esophagitis: Secondary | ICD-10-CM | POA: Diagnosis present

## 2019-12-23 DIAGNOSIS — D259 Leiomyoma of uterus, unspecified: Secondary | ICD-10-CM | POA: Diagnosis present

## 2019-12-23 DIAGNOSIS — Z20822 Contact with and (suspected) exposure to covid-19: Secondary | ICD-10-CM | POA: Diagnosis present

## 2019-12-23 DIAGNOSIS — Z9889 Other specified postprocedural states: Secondary | ICD-10-CM

## 2019-12-23 HISTORY — PX: MYOMECTOMY: SHX85

## 2019-12-23 LAB — POCT PREGNANCY, URINE: Preg Test, Ur: NEGATIVE

## 2019-12-23 SURGERY — MYOMECTOMY, ABDOMINAL APPROACH
Anesthesia: General | Site: Abdomen

## 2019-12-23 MED ORDER — ONDANSETRON HCL 4 MG/2ML IJ SOLN
INTRAMUSCULAR | Status: DC | PRN
  Administered 2019-12-23: 4 mg via INTRAVENOUS

## 2019-12-23 MED ORDER — BUPIVACAINE HCL (PF) 0.25 % IJ SOLN
INTRAMUSCULAR | Status: AC
  Filled 2019-12-23: qty 30

## 2019-12-23 MED ORDER — HYDROMORPHONE HCL 2 MG PO TABS
2.0000 mg | ORAL_TABLET | Freq: Four times a day (QID) | ORAL | Status: DC | PRN
  Administered 2019-12-24 (×2): 2 mg via ORAL
  Filled 2019-12-23 (×2): qty 1

## 2019-12-23 MED ORDER — CYCLOBENZAPRINE HCL 10 MG PO TABS
10.0000 mg | ORAL_TABLET | Freq: Three times a day (TID) | ORAL | Status: DC | PRN
  Administered 2019-12-23 – 2019-12-24 (×3): 10 mg via ORAL
  Filled 2019-12-23 (×3): qty 1

## 2019-12-23 MED ORDER — MENTHOL 3 MG MT LOZG: 1.0000 | LOZENGE | OROMUCOSAL | Status: DC | PRN

## 2019-12-23 MED ORDER — DEXAMETHASONE SODIUM PHOSPHATE 10 MG/ML IJ SOLN
INTRAMUSCULAR | Status: DC | PRN
  Administered 2019-12-23: 8 mg via INTRAVENOUS

## 2019-12-23 MED ORDER — ROCURONIUM BROMIDE 10 MG/ML (PF) SYRINGE
PREFILLED_SYRINGE | INTRAVENOUS | Status: AC
  Filled 2019-12-23: qty 10

## 2019-12-23 MED ORDER — DEXAMETHASONE SODIUM PHOSPHATE 10 MG/ML IJ SOLN
INTRAMUSCULAR | Status: AC
  Filled 2019-12-23: qty 1

## 2019-12-23 MED ORDER — LIDOCAINE 2% (20 MG/ML) 5 ML SYRINGE
INTRAMUSCULAR | Status: DC | PRN
  Administered 2019-12-23: 40 mg via INTRAVENOUS

## 2019-12-23 MED ORDER — EPHEDRINE 5 MG/ML INJ
INTRAVENOUS | Status: AC
  Filled 2019-12-23: qty 10

## 2019-12-23 MED ORDER — DIPHENHYDRAMINE HCL 50 MG/ML IJ SOLN: 12.5000 mg | Freq: Four times a day (QID) | INTRAMUSCULAR | Status: DC | PRN

## 2019-12-23 MED ORDER — VASOPRESSIN 20 UNIT/ML IV SOLN
INTRAVENOUS | Status: AC
  Filled 2019-12-23: qty 1

## 2019-12-23 MED ORDER — FENTANYL CITRATE (PF) 100 MCG/2ML IJ SOLN: 100.0000 ug | Freq: Once | INTRAMUSCULAR | Status: AC

## 2019-12-23 MED ORDER — HYDROMORPHONE HCL 1 MG/ML IJ SOLN
INTRAMUSCULAR | Status: AC
Start: 2019-12-23 — End: 2019-12-24
  Filled 2019-12-23: qty 1

## 2019-12-23 MED ORDER — ONDANSETRON HCL 4 MG/2ML IJ SOLN
INTRAMUSCULAR | Status: AC
  Filled 2019-12-23: qty 2

## 2019-12-23 MED ORDER — ONDANSETRON HCL 4 MG/2ML IJ SOLN: 4.0000 mg | Freq: Four times a day (QID) | INTRAMUSCULAR | Status: DC | PRN

## 2019-12-23 MED ORDER — DEXMEDETOMIDINE HCL 200 MCG/2ML IV SOLN
INTRAVENOUS | Status: DC | PRN
  Administered 2019-12-23 (×2): 4 ug via INTRAVENOUS

## 2019-12-23 MED ORDER — HYDROMORPHONE HCL 1 MG/ML IJ SOLN
1.0000 mg | Freq: Once | INTRAMUSCULAR | Status: AC
  Administered 2019-12-23: 1 mg via INTRAVENOUS

## 2019-12-23 MED ORDER — KETOROLAC TROMETHAMINE 30 MG/ML IJ SOLN
30.0000 mg | Freq: Once | INTRAMUSCULAR | Status: AC
  Administered 2019-12-23: 30 mg via INTRAVENOUS

## 2019-12-23 MED ORDER — SIMETHICONE 80 MG PO CHEW
80.0000 mg | CHEWABLE_TABLET | Freq: Four times a day (QID) | ORAL | Status: DC | PRN
  Administered 2019-12-24: 80 mg via ORAL
  Filled 2019-12-23: qty 1

## 2019-12-23 MED ORDER — MIDAZOLAM HCL 2 MG/2ML IJ SOLN
INTRAMUSCULAR | Status: AC
  Administered 2019-12-23: 2 mg via INTRAVENOUS
  Filled 2019-12-23: qty 2

## 2019-12-23 MED ORDER — CHLORHEXIDINE GLUCONATE 0.12 % MT SOLN
15.0000 mL | Freq: Once | OROMUCOSAL | Status: AC
  Administered 2019-12-23: 15 mL via OROMUCOSAL
  Filled 2019-12-23: qty 15

## 2019-12-23 MED ORDER — BUPIVACAINE HCL (PF) 0.25 % IJ SOLN
INTRAMUSCULAR | Status: DC | PRN
  Administered 2019-12-23: 10 mL

## 2019-12-23 MED ORDER — MONTELUKAST SODIUM 10 MG PO TABS
10.0000 mg | ORAL_TABLET | Freq: Every day | ORAL | Status: DC
  Administered 2019-12-23 – 2019-12-24 (×2): 10 mg via ORAL
  Filled 2019-12-23 (×2): qty 1

## 2019-12-23 MED ORDER — KETOROLAC TROMETHAMINE 30 MG/ML IJ SOLN
INTRAMUSCULAR | Status: AC
  Administered 2019-12-23: 30 mg
  Filled 2019-12-23: qty 1

## 2019-12-23 MED ORDER — HYDROMORPHONE 1 MG/ML IV SOLN
INTRAVENOUS | Status: DC
  Administered 2019-12-23: 30 mg via INTRAVENOUS
  Administered 2019-12-24: 0.6 mg via INTRAVENOUS
  Administered 2019-12-24: 1.2 mg via INTRAVENOUS
  Administered 2019-12-24: 0.6 mg via INTRAVENOUS
  Filled 2019-12-23: qty 30

## 2019-12-23 MED ORDER — ONDANSETRON HCL 4 MG PO TABS: 4.0000 mg | ORAL_TABLET | Freq: Four times a day (QID) | ORAL | Status: DC | PRN

## 2019-12-23 MED ORDER — IBUPROFEN 800 MG PO TABS
800.0000 mg | ORAL_TABLET | Freq: Four times a day (QID) | ORAL | Status: DC
  Administered 2019-12-24 (×2): 800 mg via ORAL
  Filled 2019-12-23 (×2): qty 1

## 2019-12-23 MED ORDER — KETOROLAC TROMETHAMINE 30 MG/ML IJ SOLN
30.0000 mg | Freq: Four times a day (QID) | INTRAMUSCULAR | Status: AC
  Administered 2019-12-23 – 2019-12-24 (×4): 30 mg via INTRAVENOUS
  Filled 2019-12-23 (×4): qty 1

## 2019-12-23 MED ORDER — FENTANYL CITRATE (PF) 100 MCG/2ML IJ SOLN
INTRAMUSCULAR | Status: AC
Start: 2019-12-23 — End: 2019-12-23
  Administered 2019-12-23: 100 ug via INTRAVENOUS
  Filled 2019-12-23: qty 2

## 2019-12-23 MED ORDER — FLUTICASONE PROPIONATE 50 MCG/ACT NA SUSP
2.0000 | Freq: Every day | NASAL | Status: DC
  Administered 2019-12-24 – 2019-12-25 (×2): 2 via NASAL
  Filled 2019-12-23: qty 16

## 2019-12-23 MED ORDER — PROPOFOL 10 MG/ML IV BOLUS
INTRAVENOUS | Status: DC | PRN
  Administered 2019-12-23: 200 mg via INTRAVENOUS

## 2019-12-23 MED ORDER — PANTOPRAZOLE SODIUM 40 MG PO TBEC
40.0000 mg | DELAYED_RELEASE_TABLET | Freq: Every day | ORAL | Status: DC
  Administered 2019-12-25: 40 mg via ORAL
  Filled 2019-12-23 (×2): qty 1

## 2019-12-23 MED ORDER — ORAL CARE MOUTH RINSE: 15.0000 mL | Freq: Once | OROMUCOSAL | Status: AC

## 2019-12-23 MED ORDER — BUPIVACAINE HCL (PF) 0.25 % IJ SOLN
INTRAMUSCULAR | Status: DC | PRN
  Administered 2019-12-23 (×2): 20 mL

## 2019-12-23 MED ORDER — FENTANYL CITRATE (PF) 250 MCG/5ML IJ SOLN
INTRAMUSCULAR | Status: DC | PRN
  Administered 2019-12-23 (×2): 50 ug via INTRAVENOUS
  Administered 2019-12-23: 100 ug via INTRAVENOUS
  Administered 2019-12-23: 50 ug via INTRAVENOUS

## 2019-12-23 MED ORDER — PROMETHAZINE HCL 25 MG/ML IJ SOLN: 6.2500 mg | INTRAMUSCULAR | Status: DC | PRN

## 2019-12-23 MED ORDER — SODIUM CHLORIDE BACTERIOSTATIC 0.9 % IJ SOLN
INTRAMUSCULAR | Status: DC | PRN
  Administered 2019-12-23: 90 mL

## 2019-12-23 MED ORDER — ALBUTEROL SULFATE (2.5 MG/3ML) 0.083% IN NEBU: 3.0000 mL | INHALATION_SOLUTION | RESPIRATORY_TRACT | Status: DC | PRN

## 2019-12-23 MED ORDER — SCOPOLAMINE 1 MG/3DAYS TD PT72
MEDICATED_PATCH | TRANSDERMAL | Status: DC | PRN
  Administered 2019-12-23: 1 via TRANSDERMAL

## 2019-12-23 MED ORDER — FENTANYL CITRATE (PF) 100 MCG/2ML IJ SOLN
25.0000 ug | INTRAMUSCULAR | Status: DC | PRN
  Administered 2019-12-23 (×3): 50 ug via INTRAVENOUS

## 2019-12-23 MED ORDER — PHENYLEPHRINE 40 MCG/ML (10ML) SYRINGE FOR IV PUSH (FOR BLOOD PRESSURE SUPPORT)
PREFILLED_SYRINGE | INTRAVENOUS | Status: AC
  Filled 2019-12-23: qty 10

## 2019-12-23 MED ORDER — SODIUM CHLORIDE 0.9% FLUSH: 9.0000 mL | INTRAVENOUS | Status: DC | PRN

## 2019-12-23 MED ORDER — VASOPRESSIN 20 UNIT/ML IV SOLN
INTRAVENOUS | Status: DC | PRN
  Administered 2019-12-23: 60 [IU] via INTRAOSSEOUS

## 2019-12-23 MED ORDER — BUPIVACAINE HCL (PF) 0.25 % IJ SOLN: INTRAMUSCULAR | Status: DC | PRN

## 2019-12-23 MED ORDER — DEXMEDETOMIDINE (PRECEDEX) IN NS 20 MCG/5ML (4 MCG/ML) IV SYRINGE
PREFILLED_SYRINGE | INTRAVENOUS | Status: AC
  Filled 2019-12-23: qty 5

## 2019-12-23 MED ORDER — NALOXONE HCL 0.4 MG/ML IJ SOLN: 0.4000 mg | INTRAMUSCULAR | Status: DC | PRN

## 2019-12-23 MED ORDER — LIDOCAINE 2% (20 MG/ML) 5 ML SYRINGE
INTRAMUSCULAR | Status: AC
  Filled 2019-12-23: qty 5

## 2019-12-23 MED ORDER — FENTANYL CITRATE (PF) 100 MCG/2ML IJ SOLN
INTRAMUSCULAR | Status: AC
  Filled 2019-12-23: qty 2

## 2019-12-23 MED ORDER — MIDAZOLAM HCL 2 MG/2ML IJ SOLN
INTRAMUSCULAR | Status: DC | PRN
  Administered 2019-12-23 (×2): 1 mg via INTRAVENOUS

## 2019-12-23 MED ORDER — LACTATED RINGERS IV SOLN: INTRAVENOUS | Status: DC

## 2019-12-23 MED ORDER — MIDAZOLAM HCL 2 MG/2ML IJ SOLN: 2.0000 mg | Freq: Once | INTRAMUSCULAR | Status: AC

## 2019-12-23 MED ORDER — DOCUSATE SODIUM 100 MG PO CAPS
100.0000 mg | ORAL_CAPSULE | Freq: Two times a day (BID) | ORAL | Status: DC
  Administered 2019-12-23 – 2019-12-25 (×4): 100 mg via ORAL
  Filled 2019-12-23 (×4): qty 1

## 2019-12-23 MED ORDER — FENTANYL CITRATE (PF) 100 MCG/2ML IJ SOLN
INTRAMUSCULAR | Status: AC
Start: 2019-12-23 — End: 2019-12-24
  Filled 2019-12-23: qty 2

## 2019-12-23 MED ORDER — CEFAZOLIN SODIUM-DEXTROSE 2-4 GM/100ML-% IV SOLN
2.0000 g | INTRAVENOUS | Status: AC
  Administered 2019-12-23: 2 g via INTRAVENOUS
  Filled 2019-12-23: qty 100

## 2019-12-23 MED ORDER — FENTANYL CITRATE (PF) 250 MCG/5ML IJ SOLN
INTRAMUSCULAR | Status: AC
  Filled 2019-12-23: qty 5

## 2019-12-23 MED ORDER — VASOPRESSIN 20 UNIT/ML IV SOLN
INTRAVENOUS | Status: AC
  Filled 2019-12-23: qty 2

## 2019-12-23 MED ORDER — ESTRADIOL 2 MG PO TABS
2.0000 mg | ORAL_TABLET | Freq: Every day | ORAL | Status: DC
  Filled 2019-12-23 (×2): qty 1

## 2019-12-23 MED ORDER — DIPHENHYDRAMINE HCL 12.5 MG/5ML PO ELIX: 12.5000 mg | ORAL_SOLUTION | Freq: Four times a day (QID) | ORAL | Status: DC | PRN

## 2019-12-23 MED ORDER — PROPOFOL 10 MG/ML IV BOLUS
INTRAVENOUS | Status: AC
  Filled 2019-12-23: qty 20

## 2019-12-23 MED ORDER — ROCURONIUM BROMIDE 10 MG/ML (PF) SYRINGE
PREFILLED_SYRINGE | INTRAVENOUS | Status: DC | PRN
  Administered 2019-12-23: 20 mg via INTRAVENOUS
  Administered 2019-12-23: 60 mg via INTRAVENOUS

## 2019-12-23 MED ORDER — POVIDONE-IODINE 10 % EX SWAB
2.0000 "application " | Freq: Once | CUTANEOUS | Status: AC
  Administered 2019-12-23: 2 via TOPICAL

## 2019-12-23 MED ORDER — MIDAZOLAM HCL 2 MG/2ML IJ SOLN
INTRAMUSCULAR | Status: AC
  Filled 2019-12-23: qty 2

## 2019-12-23 MED ORDER — SODIUM CHLORIDE (PF) 0.9 % IJ SOLN
INTRAMUSCULAR | Status: AC
  Filled 2019-12-23: qty 100

## 2019-12-23 MED ORDER — BUPIVACAINE LIPOSOME 1.3 % IJ SUSP
INTRAMUSCULAR | Status: DC | PRN
  Administered 2019-12-23 (×2): 10 mL

## 2019-12-23 SURGICAL SUPPLY — 41 items
BARRIER ADHS 3X4 INTERCEED (GAUZE/BANDAGES/DRESSINGS) IMPLANT
BENZOIN TINCTURE PRP APPL 2/3 (GAUZE/BANDAGES/DRESSINGS) ×2 IMPLANT
CANISTER SUCT 3000ML PPV (MISCELLANEOUS) ×2 IMPLANT
CONT SPEC PATH 64OZ SNAP LID (MISCELLANEOUS) ×2 IMPLANT
DECANTER SPIKE VIAL GLASS SM (MISCELLANEOUS) ×2 IMPLANT
DRAPE CESAREAN BIRTH W POUCH (DRAPES) ×2 IMPLANT
DRSG OPSITE POSTOP 4X10 (GAUZE/BANDAGES/DRESSINGS) ×2 IMPLANT
DURAPREP 26ML APPLICATOR (WOUND CARE) ×2 IMPLANT
ELECT NEEDLE TIP 2.8 STRL (NEEDLE) IMPLANT
FILTER STRAW FLUID ASPIR (MISCELLANEOUS) IMPLANT
GAUZE 4X4 16PLY RFD (DISPOSABLE) IMPLANT
GLOVE BIO SURGEON STRL SZ7 (GLOVE) ×2 IMPLANT
GLOVE BIOGEL PI IND STRL 7.0 (GLOVE) ×2 IMPLANT
GLOVE BIOGEL PI INDICATOR 7.0 (GLOVE) ×2
GOWN STRL REUS W/ TWL LRG LVL3 (GOWN DISPOSABLE) ×3 IMPLANT
GOWN STRL REUS W/TWL LRG LVL3 (GOWN DISPOSABLE) ×3
KIT TURNOVER KIT B (KITS) ×2 IMPLANT
NEEDLE 18GX1X1/2 (RX/OR ONLY) (NEEDLE) ×2 IMPLANT
NEEDLE HYPO 22GX1.5 SAFETY (NEEDLE) ×4 IMPLANT
NS IRRIG 1000ML POUR BTL (IV SOLUTION) ×2 IMPLANT
PACK ABDOMINAL GYN (CUSTOM PROCEDURE TRAY) ×2 IMPLANT
PAD ARMBOARD 7.5X6 YLW CONV (MISCELLANEOUS) ×2 IMPLANT
PAD OB MATERNITY 4.3X12.25 (PERSONAL CARE ITEMS) ×2 IMPLANT
PENCIL SMOKE EVACUATOR (MISCELLANEOUS) ×2 IMPLANT
STAPLER VISISTAT 35W (STAPLE) IMPLANT
STRIP CLOSURE SKIN 1/2X4 (GAUZE/BANDAGES/DRESSINGS) ×2 IMPLANT
SUT MNCRL 0 MO-4 VIOLET 18 CR (SUTURE) ×1 IMPLANT
SUT MON AB 2-0 CT1 27 (SUTURE) ×2 IMPLANT
SUT MON AB 2-0 SH 27 (SUTURE) ×1
SUT MON AB 2-0 SH27 (SUTURE) ×1 IMPLANT
SUT MON AB 3-0 SH 27 (SUTURE)
SUT MON AB 3-0 SH27 (SUTURE) IMPLANT
SUT MONOCRYL 0 MO 4 18  CR/8 (SUTURE) ×1
SUT VIC AB 0 CT1 27 (SUTURE) ×2
SUT VIC AB 0 CT1 27XBRD ANBCTR (SUTURE) ×2 IMPLANT
SUT VIC AB 0 CT1 36 (SUTURE) IMPLANT
SUT VIC AB 4-0 KS 27 (SUTURE) ×2 IMPLANT
SYR 10ML LL (SYRINGE) ×2 IMPLANT
SYR CONTROL 10ML LL (SYRINGE) ×4 IMPLANT
TOWEL GREEN STERILE FF (TOWEL DISPOSABLE) ×4 IMPLANT
TRAY FOLEY W/BAG SLVR 14FR (SET/KITS/TRAYS/PACK) ×2 IMPLANT

## 2019-12-23 NOTE — Anesthesia Preprocedure Evaluation (Addendum)
Anesthesia Evaluation  Patient identified by MRN, date of birth, ID band Patient awake    Reviewed: Allergy & Precautions, NPO status , Patient's Chart, lab work & pertinent test results  History of Anesthesia Complications Negative for: history of anesthetic complications  Airway Mallampati: II  TM Distance: >3 FB Neck ROM: Full    Dental  (+) Dental Advisory Given, Chipped,    Pulmonary asthma ,    Pulmonary exam normal        Cardiovascular negative cardio ROS Normal cardiovascular exam     Neuro/Psych negative neurological ROS  negative psych ROS   GI/Hepatic Neg liver ROS, hiatal hernia, GERD  Medicated and Controlled,  Endo/Other  negative endocrine ROS  Renal/GU negative Renal ROS     Musculoskeletal negative musculoskeletal ROS (+)   Abdominal   Peds  Hematology negative hematology ROS (+)   Anesthesia Other Findings Covid test negative   Reproductive/Obstetrics  Fibroids                             Anesthesia Physical Anesthesia Plan  ASA: II  Anesthesia Plan: General   Post-op Pain Management:  Regional for Post-op pain   Induction: Intravenous  PONV Risk Score and Plan: 4 or greater and Treatment may vary due to age or medical condition, Ondansetron, Midazolam, Dexamethasone and Scopolamine patch - Pre-op  Airway Management Planned: Oral ETT  Additional Equipment: None  Intra-op Plan:   Post-operative Plan: Extubation in OR  Informed Consent: I have reviewed the patients History and Physical, chart, labs and discussed the procedure including the risks, benefits and alternatives for the proposed anesthesia with the patient or authorized representative who has indicated his/her understanding and acceptance.     Dental advisory given  Plan Discussed with: CRNA and Anesthesiologist  Anesthesia Plan Comments:        Anesthesia Quick Evaluation

## 2019-12-23 NOTE — Plan of Care (Signed)
  Problem: Education: Goal: Knowledge of General Education information will improve Description Including pain rating scale, medication(s)/side effects and non-pharmacologic comfort measures Outcome: Progressing   

## 2019-12-23 NOTE — Progress Notes (Signed)
Admitted to 6N 29 status post myomectomy. Incision intact, foley in place,bleeding noted vaginally. MD aware, will endorse and moniltor appropriately.

## 2019-12-23 NOTE — H&P (Signed)
Lori Carson is an 29 y.o. female with menorrhagia, anemia, failed medical therapy due to fibroids, esp with submucosal component. Pt has h/o Myomectomy (open) in 2018 and more recently a hysteroscopic myomectomy in office 4 mo back, but bleeding has not stopped since then, except with high dose progestin and adding estradiol tablets  Patient has anemia and has received multiple IV iron infusions, seeing Hematologist.  Nl Paps. No sexual partner now, not attempted pregnancies but wishes to have kids, and declines hysterectomy   Patient's last menstrual period was 12/07/2019.    Past Medical History:  Diagnosis Date  . Anemia    borderline  . Asthma   . Chest pain    left arm numbness but can take tums and goes away  . Fibroids   . GERD (gastroesophageal reflux disease)     Past Surgical History:  Procedure Laterality Date  . MYOMECTOMY N/A 07/08/2016   Procedure: MYOMECTOMY with peritoneal biopsy;  Surgeon: Everett Graff, MD;  Location: [facility];  Service: Gynecology;  Laterality: N/A;  . OVARIAN CYST REMOVAL N/A 07/08/2016   Procedure: OVARIAN CYSTECTOMY right, left paratubal cystectomy;  Surgeon: Everett Graff, MD;  Location: [facility];  Service: Gynecology;  Laterality: N/A;  . TONSILLECTOMY    . UPPER GI ENDOSCOPY    . WISDOM TOOTH EXTRACTION      Family History  Problem Relation Age of Onset  . Asthma Mother   . Asthma Father   . Asthma Brother   . Asthma Paternal Grandfather   . Allergic rhinitis Neg Hx   . Angioedema Neg Hx   . Eczema Neg Hx   . Immunodeficiency Neg Hx   . Urticaria Neg Hx     Social History:  reports that she has never smoked. She has never used smokeless tobacco. She reports current alcohol use. She reports that she does not use drugs.  Allergies:  Allergies  Allergen Reactions  . Oxycodone Itching    Medications Prior to Admission  Medication Sig Dispense Refill Last Dose  . albuterol (VENTOLIN HFA) 108 (90 Base) MCG/ACT inhaler Inhale 2  puffs into the lungs every 4 (four) hours as needed for wheezing or shortness of breath. 18 g 3 Past Week at Unknown time  . cyclobenzaprine (FLEXERIL) 10 MG tablet Take 10 mg by mouth 3 (three) times daily as needed for muscle spasms.    Past Week at Unknown time  . estradiol (ESTRACE) 2 MG tablet Take 2 mg by mouth daily.   Past Week at Unknown time  . fluticasone (FLONASE) 50 MCG/ACT nasal spray Place 2 sprays into both nostrils daily. As needed. 1 g 5 12/23/2019 at 1000  . HYDROmorphone (DILAUDID) 2 MG tablet Take 2 mg by mouth every 6 (six) hours as needed for severe pain.      . megestrol (MEGACE) 40 MG tablet Take 40 mg by mouth daily.   Past Week at Unknown time  . montelukast (SINGULAIR) 10 MG tablet Take 1 tablet (10 mg total) by mouth at bedtime. 30 tablet 5 Past Month at Unknown time  . pantoprazole (PROTONIX) 40 MG tablet Take 1 tablet (40 mg total) by mouth 2 (two) times daily. (Patient taking differently: Take 40 mg by mouth daily. ) 60 tablet 5 12/23/2019 at 1000  . ALPRAZolam (XANAX) 0.25 MG tablet Take 1 tablet by mouth 2 (two) times daily as needed. (Patient not taking: Reported on 12/13/2019)   Not Taking at Unknown time  . azelastine (ASTELIN) 0.1 % nasal spray  Place 2 sprays into both nostrils 2 (two) times daily. (Patient taking differently: Place 2 sprays into both nostrils 2 (two) times daily. As needed.) 30 mL 5   . Cholecalciferol 1.25 MG (50000 UT) capsule Take 1 capsule by mouth daily. (Patient not taking: Reported on 12/13/2019)   Not Taking at Unknown time  . EPINEPHrine (AUVI-Q) 0.3 mg/0.3 mL IJ SOAJ injection Use as directed for severe allergic reaction (Patient not taking: Reported on 12/13/2019) 2 each 2 Not Taking at Unknown time  . ibuprofen (ADVIL,MOTRIN) 600 MG tablet 1  po pc every 6 hours for 5 days then prn-pain (Patient not taking: Reported on 12/13/2019) 30 tablet 1 Not Taking at Unknown time  . levocetirizine (XYZAL) 5 MG tablet Take 1 tablet (5 mg total) by mouth  every evening. (Patient not taking: Reported on 12/13/2019) 30 tablet 5 Not Taking at Unknown time  . mometasone (NASONEX) 50 MCG/ACT nasal spray Place 2 sprays into the nose daily as needed (congestion). (Patient not taking: Reported on 12/13/2019)   Not Taking at Unknown time  . Mometasone Furoate (ASMANEX HFA) 100 MCG/ACT AERO Inhale 2 puffs into the lungs 2 (two) times a day. (Patient taking differently: Inhale 2 puffs into the lungs 2 (two) times a day. Using the sample given as needed.) 13 g 5   . Mometasone Furoate (ASMANEX HFA) 100 MCG/ACT AERO Inhale 2 puffs into the lungs 2 (two) times daily. (Patient not taking: Reported on 12/13/2019) 13 g 5 Not Taking at Unknown time  . Multiple Vitamins-Minerals (MULTIVITAMIN ADULT EXTRA C PO) multivitamin (Patient not taking: Reported on 12/13/2019)   Not Taking at Unknown time  . naproxen (NAPROSYN) 500 MG tablet naproxen 500 mg tablet  Take 1 tablet twice a day by oral route. (Patient not taking: Reported on 12/13/2019)   Not Taking at Unknown time  . norethindrone (AYGESTIN) 5 MG tablet Take 2 tablets by mouth daily. fibroids (Patient not taking: Reported on 12/13/2019)   Not Taking at Unknown time  . Olopatadine HCl (PATADAY) 0.2 % SOLN Place 1 drop into both eyes daily as needed. (Patient not taking: Reported on 08/27/2019) 1 Bottle 5   . terconazole (TERAZOL 7) 0.4 % vaginal cream Place 1 applicator vaginally at bedtime as needed. (Patient not taking: Reported on 12/13/2019)   Not Taking at Unknown time    Review of Systems no CP SOB  Blood pressure 121/85, pulse 68, temperature 97.8 F (36.6 C), temperature source Oral, resp. rate 14, height 5\' 7"  (1.702 m), weight 80.5 kg, last menstrual period 12/07/2019, SpO2 100 %. Physical Exam A&O x 3, no acute distress. 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 With menorrhagia from fibroids, failed medical therapy.  Here for abdominal myomectomy. Recent failed hysteroscopic resection since after surgery she never stopped bleeding and changed practice and started to see [place].  Plan myomectomy of as many fibroids as possible but spare uterus, no plans of hysterectomy since not completed child-bearing. Understands sometimes that means we may leave fiboid by blood vessels or tubes to prevent risk of hysterectomy or infertility. Also d/w pt risk of adhesions and future infertility and future new fibroids needing more surgeries.  Risks/complications of surgery reviewed incl infection, bleeding, damage to internal organs including bladder, bowels, ureters, blood vessels, other risks from anesthesia, VTE and delayed complications of any surgery, complications in future surgery reviewed.   Elveria Royals 12/23/2019, 1:34 PM

## 2019-12-23 NOTE — Progress Notes (Signed)
Patient received from [place] 5 minutes, patient verbalizes ahe felt like she is bleeding, noted clots and bed linens were saturated with blood. VSS. MD made aware. Will continue to monitor.

## 2019-12-23 NOTE — Anesthesia Procedure Notes (Signed)
Procedure Name: Intubation Date/Time: 12/23/2019 1:42 PM Performed by: Verdie Drown, CRNA Pre-anesthesia Checklist: Patient identified, Emergency Drugs available, Suction available and Patient being monitored Patient Re-evaluated:Patient Re-evaluated prior to induction Oxygen Delivery Method: Circle System Utilized Preoxygenation: Pre-oxygenation with 100% oxygen Induction Type: IV induction Ventilation: Mask ventilation without difficulty Laryngoscope Size: Mac and 3 Tube type: Oral Tube size: 7.0 mm Number of attempts: 1 Airway Equipment and Method: Stylet and Oral airway Placement Confirmation: ETT inserted through vocal cords under direct vision,  positive ETCO2 and breath sounds checked- equal and bilateral Secured at: 21 cm Tube secured with: Tape Dental Injury: Teeth and Oropharynx as per pre-operative assessment

## 2019-12-23 NOTE — Anesthesia Procedure Notes (Addendum)
Anesthesia Regional Block: TAP block   Pre-Anesthetic Checklist: ,, timeout performed, Correct Patient, Correct Site, Correct Laterality, Correct Procedure, Correct Position, site marked, Risks and benefits discussed,  Surgical consent,  Pre-op evaluation,  At surgeon's request and post-op pain management  Laterality: Right  Prep: chloraprep       Needles:  Injection technique: Single-shot  Needle Type: Echogenic Needle     Needle Length: 10cm  Needle Gauge: 21     Additional Needles:   Narrative:  Start time: 12/23/2019 1:12 PM End time: 12/23/2019 1:15 PM Injection made incrementally with aspirations every 5 mL.  Performed by: Personally  Anesthesiologist: Audry Pili, MD  Additional Notes: No pain on injection. No increased resistance to injection. Injection made in 5cc increments. Good needle visualization. Patient tolerated the procedure well.

## 2019-12-23 NOTE — Op Note (Signed)
PRE-OPERATIVE DIAGNOSIS:  Uterine Fibroids  POST-OPERATIVE DIAGNOSIS:  Uterine Fibroids  PROCEDURE: Abdominal Myomectomy, lysis of adhesions   SURGEON: Elveria Royals, MD  ASSISTANT:  Tiana Loft, MD  ANESTHESIA:  General endotracheal  EBL: 30 cc  IVF: LR 1300   Urine output: urine in foley  50 cc  LOCAL MEDICATIONS USED:  MARCAINE 0.25% 10 cc skin infiltration     SPECIMEN:  4 fibroids, 58 gms   DISPOSITION OF SPECIMEN:  PATHOLOGY  COUNTS:  YES  PATIENT DISPOSITION:  PACU - hemodynamically stable. Then admit for post-op care.   Delay start of Pharmacological VTE agent (>24hrs) due to surgical blood loss or risk of bleeding: yes  PROCEDURE:   Indication:  Symptomatic uterine fibroids with menorrhagia, failed medical therapy and failed recent hysteroscopic myomectomy. Patient has not completed child-bearing, so hysterectomy must be avoided at all cost per patient. Office sono noted a 4 cm type II submucosal myoma but MRI reported no intracavity myoma. Due to ongoing menorrhagia following hysteroscopic myomectomy, I suspected partially resected type II submucosal myoma and wound require palpation to remove myoma, so abdominal route was chose after counseling, using prior abdominal myomectomy incision.   Risks and complications of surgery including infection, bleeding, damage to internal organs and other including but not limited to surgery related problems including pneumonia, VTE reviewed. Informed written consent was obtained.   Patient was brought to the operating room with IV running. She received 2 gm Ancef. Underwent general anesthesia without difficulty and was given dorsal supine position, prepped and draped in sterile fashion. Foley catheter was placed. Minilaparotomy incision made via prior scar that was a small Pfannenstiel incision incision. It carried down to the underlying fascia with Bovie with excellent hemostasis.  Fascia incised and extended laterally. Fascia  grasped with Kocher's and underlying rectus muscles were dissected down with some difficulty due to dense scarring of rectus muscles. Rectus muscles were fused in midline and were carefully separated using scalpel and tenting peritoneum and incising through clear window.  Uterus was only 10 wks. Adhesions of right adnexa released and anterior right round ligament area released and now uterus freed up. Bowels packed with 2 moist labs. Anterior fibroid was assessed, Vasopressin injected in serosa and vertical incision made with Bovie, fibroid reached and grasped with towel clamp to elevate the uterus upto the incision. We left the towel clamp on the fibroid to assist with uterine manipulation. Now uterus palpated to assess where the intramural myoma with cavity involvement was. Another incision made in fundal aspect of uterus after Vasopressin injection and carried down to the fibroid with Bovie. Now fibroid was grasped with towel clamp. Cavity was reached as I dissected the myoma with only sharp scissors. Fibroid enucleated out entirely. Now tow other fibroids were removed by tunneling from anterior lower incision. We had to leave one fibroid that was located on right lower isthmic area due to likely risk to injure uterine vessel and risk of hysterectomy. This myoma was not palpated through the cavity and should not cause abnormal menses or affect pregnancy. Both uterine incisions were closed in 3 layers, first two with 0-Monocryl and serosa with 2-0 Monocryl. Hemostasis was noted. Both tubes and ovaries appeared normal. Slight adhesions remained on right ovary to bowel but right ovary to tube adhesions and round ligament adhesions were released by sharp dissection. Left adnexa were free of adhesions. Suction irrigation done. Interceed placed on both the incisions on the uterus after noting hemostasis.  Lap packs removed.  Peritoneal edges grasped and peritoneum closed with 2-0 Vicryl. Fascia sutured with 0 Vicryl.  Subcutaneous layer was deep and closed with 2-0 Plain gut. Skin approximated with 4-0 Vicryl in subcuticular fashion.  Sterile dressing placed.  Sterile Honeycomb dressing placed.  All  Instruments/ lap/ sponges counts were correct x2. No complications.  Dr Benjie Karvonen was the surgeon for entire case.

## 2019-12-23 NOTE — Transfer of Care (Signed)
Immediate Anesthesia Transfer of Care Note  Patient: Lori Carson  Procedure(s) Performed: ABDOMINAL MYOMECTOMY (N/A Abdomen)  Patient Location: PACU  Anesthesia Type:General  Level of Consciousness: awake and alert   Airway & Oxygen Therapy: Patient Spontanous Breathing and Patient connected to face mask oxygen  Post-op Assessment: Report given to RN and Post -op Vital signs reviewed and stable  Post vital signs: Reviewed and stable  Last Vitals:  Vitals Value Taken Time  BP 153/85 12/23/19 1612  Temp 36.8 C 12/23/19 1612  Pulse 82 12/23/19 1616  Resp 16 12/23/19 1616  SpO2 100 % 12/23/19 1616  Vitals shown include unvalidated device data.  Last Pain:  Vitals:   12/23/19 1612  TempSrc:   PainSc: (P) 10-Worst pain ever      Patients Stated Pain Goal: 3 (32/54/98 2641)  Complications: No complications documented.

## 2019-12-23 NOTE — Anesthesia Procedure Notes (Signed)
   Anesthesia Regional Block: TAP block   Pre-Anesthetic Checklist: ,, timeout performed, Correct Patient, Correct Site, Correct Laterality, Correct Procedure, Correct Position, site marked, Risks and benefits discussed,  Surgical consent,  Pre-op evaluation,  At surgeon's request and post-op pain management  Laterality: Left  Prep: chloraprep       Needles:  Injection technique: Single-shot  Needle Type: Echogenic Needle     Needle Length: 10cm  Needle Gauge: 21     Additional Needles:   Narrative:  Start time: 12/23/2019 1:08 PM End time: 12/23/2019 1:11 PM Injection made incrementally with aspirations every 5 mL.  Performed by: Personally  Anesthesiologist: Audry Pili, MD  Additional Notes: No pain on injection. No increased resistance to injection. Injection made in 5cc increments. Good needle visualization. Patient tolerated the procedure well.

## 2019-12-24 ENCOUNTER — Encounter (HOSPITAL_COMMUNITY): Payer: Self-pay | Admitting: Obstetrics & Gynecology

## 2019-12-24 LAB — CBC
HCT: 30.2 % — ABNORMAL LOW (ref 36.0–46.0)
Hemoglobin: 9.8 g/dL — ABNORMAL LOW (ref 12.0–15.0)
MCH: 27.4 pg (ref 26.0–34.0)
MCHC: 32.5 g/dL (ref 30.0–36.0)
MCV: 84.4 fL (ref 80.0–100.0)
Platelets: 269 10*3/uL (ref 150–400)
RBC: 3.58 MIL/uL — ABNORMAL LOW (ref 3.87–5.11)
RDW: 16.3 % — ABNORMAL HIGH (ref 11.5–15.5)
WBC: 13.7 10*3/uL — ABNORMAL HIGH (ref 4.0–10.5)
nRBC: 0 % (ref 0.0–0.2)

## 2019-12-24 MED ORDER — ESTRADIOL 1 MG PO TABS
2.0000 mg | ORAL_TABLET | Freq: Every day | ORAL | Status: DC
  Administered 2019-12-24 – 2019-12-25 (×2): 2 mg via ORAL
  Filled 2019-12-24 (×2): qty 2

## 2019-12-24 MED ORDER — CHLORHEXIDINE GLUCONATE CLOTH 2 % EX PADS
6.0000 | MEDICATED_PAD | Freq: Every day | CUTANEOUS | Status: DC
  Administered 2019-12-24: 6 via TOPICAL

## 2019-12-24 NOTE — Plan of Care (Signed)
  Problem: Education: Goal: Knowledge of General Education information will improve Description Including pain rating scale, medication(s)/side effects and non-pharmacologic comfort measures Outcome: Progressing   

## 2019-12-24 NOTE — Progress Notes (Signed)
Patient agrees to d/c PCA around 1115. She has tolerated breakfast with no issues.

## 2019-12-24 NOTE — Progress Notes (Addendum)
1 Day Post-Op Procedure(s) (LRB): ABDOMINAL MYOMECTOMY (N/A) POD #1   Subjective: Patient reports Feeling better today, pain is more controlled today.  Tolerated a liquid diet, is ready to be solid food.  Vaginal bleeding was heavy with clots yesterday, is decreased today. Denies chest pain, shortness of breath, dizziness.  Objective: I have reviewed patient's vital signs, intake and output, medications and labs.  General: alert and cooperative Resp: clear to auscultation bilaterally Cardio: regular rate and rhythm, S1, S2 normal, no murmur, click, rub or gallop GI: soft, non-tender; bowel sounds normal; no masses,  no organomegaly and incision: clean, dry and intact Extremities: extremities normal, atraumatic, no cyanosis or edema and Homans sign is negative, no sign of DVT vag bleeding decreased per pt   CBC Latest Ref Rng & Units 12/24/2019 12/21/2019 09/30/2019  WBC 4.0 - 10.5 K/uL 13.7(H) 4.8 4.8  Hemoglobin 12.0 - 15.0 g/dL 9.8(L) 12.2 11.1(L)  Hematocrit 36 - 46 % 30.2(L) 39.3 36.9  Platelets 150 - 400 K/uL 269 268 291    Assessment: s/p Procedure(s): ABDOMINAL MYOMECTOMY (N/A): stable, progressing well and tolerating diet  Plan: Advance diet  D/c foley D/c PCA mid-day once eating solid food  Ambulate Watch for vag bleeding Anticipate DC tomorrow AM   LOS: 1 day    Lori Carson 12/24/2019, 1:12 PM

## 2019-12-24 NOTE — Progress Notes (Signed)
Wasted 72mL Dilaudid PCA with Court Joy, RN as witness.

## 2019-12-25 MED ORDER — DOCUSATE SODIUM 100 MG PO CAPS
100.0000 mg | ORAL_CAPSULE | Freq: Two times a day (BID) | ORAL | 0 refills | Status: DC
Start: 2019-12-25 — End: 2021-08-14

## 2019-12-25 MED ORDER — IBUPROFEN 800 MG PO TABS
800.0000 mg | ORAL_TABLET | Freq: Four times a day (QID) | ORAL | 0 refills | Status: DC
Start: 2019-12-25 — End: 2021-08-14

## 2019-12-25 MED ORDER — SIMETHICONE 80 MG PO CHEW: 80.0000 mg | CHEWABLE_TABLET | Freq: Four times a day (QID) | ORAL | 0 refills | Status: DC | PRN

## 2019-12-25 MED ORDER — HYDROMORPHONE HCL 2 MG PO TABS: 1.0000 mg | ORAL_TABLET | Freq: Four times a day (QID) | ORAL | 0 refills | Status: AC | PRN

## 2019-12-25 MED ORDER — ACETAMINOPHEN 500 MG PO TABS: 500.0000 mg | ORAL_TABLET | Freq: Four times a day (QID) | ORAL | 0 refills | Status: DC | PRN

## 2019-12-25 NOTE — Discharge Instructions (Signed)
Myomectomy, Care After This sheet gives you information about how to care for yourself after your procedure. Your health care provider may also give you more specific instructions. If you have problems or questions, contact your health care provider. What can I expect after the procedure? After the procedure, it is common to have:  Pain in your abdomen, especially at the incision areas. You will be given pain medicine to control the pain.  Tiredness. This is a normal part of the recovery process. Your energy level will return to normal over the coming weeks.  Vaginal bleeding. This is normal and will stop in the coming weeks.  Constipation. Recovery time from this procedure will depend on the type of procedure you had and your general overall health prior to the procedure. Follow these instructions at home: Medicines  Take over-the-counter and prescription medicines only as told by your health care provider.  Do not take aspirin because it can cause bleeding.  If you were prescribed an antibiotic medicine, use it as told by your health care provider. Do not stop using the antibiotic even if you start to feel better.  Do not drive or use heavy machinery while taking prescription pain medicine.  Do not drink alcohol while taking prescription pain medicine. Incision care   Follow instructions from your health care provider about how to take care of any incisions. Make sure you: ? Wash your hands with soap and water before you change your bandage (dressing). If soap and water are not available, use hand sanitizer. ? Change your dressing as told by your health care provider. ? Leave stitches (sutures), skin glue, or adhesive strips in place. These skin closures may need to stay in place for 2 weeks or longer. If adhesive strip edges start to loosen and curl up, you may trim the loose edges. Do not remove adhesive strips completely unless your health care provider tells you to do  that.  Check your incision areas every day for signs of infection. Check for: ? Redness, swelling, or pain. ? Fluid or blood. ? Warmth. ? Pus or a bad smell.  Do not take baths, swim, or use a hot tub until your health care provider approves. Take showers as directed by your health care provider. Activity  Return to your normal activities as told by your health care provider. Ask your health care provider what activities are safe for you.  Do not do activities that require a lot of effort until your health care provider says it is okay.  Do not lift anything that is heavier than 15 lb (6.8 kg) until your health care provider says that it is safe.  Do not douche, use tampons, or have sexual intercourse until your health care provider approves.  Walk daily but take frequent rest breaks if you tire easily.  Continue to practice deep breathing and coughing. If it hurts to cough, try holding a pillow against your belly as you cough.  Do not drive until your health care provider approves. General instructions  To prevent or treat constipation while you are taking prescription pain medicine, your health care provider may recommend that you: ? Drink enough fluid to keep your urine clear or pale yellow. ? Take over-the-counter or prescription medicines. ? Eat foods that are high in fiber, such as fresh fruits and vegetables, whole grains, and beans. ? Limit foods that are high in fat and processed sugars, such as fried and sweet foods.  Take your temperature twice a day   and write it down. If you develop a fever, this may be a sign that you have an infection.  Do not drink alcohol.  Have someone help you at home for 1 week or until you can do your own household activities.  Keep all follow-up visits as told by your health care provider. This is important. Contact a health care provider if:  You have a fever.  You have increasing abdominal pain that is not relieved with  medicine.  You have nausea, vomiting, or diarrhea.  You have pain when you urinate or you have blood in your urine.  You have a rash on your body.  You have pain or redness where your IV access tube was inserted.  You have redness, swelling, or pain around an incision.  You have fluid or blood coming from an incision.  An incision feels warm to the touch.  You have pus or a bad smell coming from an incision. Get help right away if:  You have weakness or light-headedness.  You have pain, swelling, or redness in your legs.  You have chest pain.  You faint.  You have shortness of breath.  You have heavy vaginal bleeding.  You have an incision that is opening up. Summary  Recovery time from this procedure will depend on the type of procedure you had and your general overall health prior to the procedure.  If you were prescribed an antibiotic medicine, use it as told by your health care provider. Do not stop using the antibiotic even if you start to feel better.  Do not douche, use tampons, or have sexual intercourse until your health care provider approves.  Return to your normal activities as told by your health care provider. Ask your health care provider what activities are safe for you. This information is not intended to replace advice given to you by your health care provider. Make sure you discuss any questions you have with your health care provider. Document Revised: 03/28/2017 Document Reviewed: 05/16/2016 Elsevier Patient Education  2020 [employer]  

## 2019-12-25 NOTE — Plan of Care (Signed)
Ready for discharge

## 2019-12-25 NOTE — Discharge Summary (Signed)
Physician Discharge Summary  Patient ID: Lori Carson MRN: [id number] DOB/AGE: 1990/07/13 29 y.o.  Admit date: 12/23/2019 Discharge date: 12/25/2019  Admission Diagnoses: Menorrhagia, fibroids, failed hysteroscopic myomectomy   Discharge Diagnoses:  Active Problems:   Uterine fibroid   S/P myomectomy via laparotomy   Discharged Condition: good  Hospital Course: Uncomplicated. Progressed to regular diet, passing flatus, ambulating, pain controlled with Ibuprofen/ Dilaudid, vaginal bleeding reduced to light on POD #2. Voiding well, ambulating well.   Discharge Exam: BP 117/69 (BP Location: Left Arm)   Pulse 69   Temp 98.2 F (36.8 C) (Oral)   Resp 14   Ht 5\' 7"  (1.702 m)   Wt 80.5 kg   LMP 12/07/2019 Comment: continual bleeding  SpO2 99%   BMI 27.80 kg/m   General: alert and cooperative Resp: clear to auscultation bilaterally Cardio: regular rate and rhythm, S1, S2 normal, no murmur, click, rub or gallop GI: soft, non-tender; bowel sounds normal; no masses,  no organomegaly and incision: clean, dry and intact Extremities: extremities normal, atraumatic, no cyanosis or edema and Homans sign is negative, no sign of DVT vag bleeding decreased per pt   CBC Latest Ref Rng & Units 12/24/2019 12/21/2019 09/30/2019  WBC 4.0 - 10.5 K/uL 13.7(H) 4.8 4.8  Hemoglobin 12.0 - 15.0 g/dL 9.8(L) 12.2 11.1(L)  Hematocrit 36 - 46 % 30.2(L) 39.3 36.9  Platelets 150 - 400 K/uL 269 268 291    Disposition: Discharge disposition: 01-Home or Self Care       Discharge Instructions    Call MD for:   Complete by: As directed    Heavy vaginal bleeding   Call MD for:  difficulty breathing, headache or visual disturbances   Complete by: As directed    Call MD for:  hives   Complete by: As directed    Call MD for:  persistant dizziness or light-headedness   Complete by: As directed    Call MD for:  persistant nausea and vomiting   Complete by: As directed    Call MD for:  redness,  tenderness, or signs of infection (pain, swelling, redness, odor or green/yellow discharge around incision site)   Complete by: As directed    Call MD for:  severe uncontrolled pain   Complete by: As directed    Call MD for:  temperature >100.4   Complete by: As directed    Diet - low sodium heart healthy   Complete by: As directed    Discharge wound care:   Complete by: As directed    Remove Honeycomb dressing on 12/30/2019   Driving Restrictions   Complete by: As directed    2 weeks   Increase activity slowly   Complete by: As directed    Lifting restrictions   Complete by: As directed    20 pounds only for 6 weeks   Sexual Activity Restrictions   Complete by: As directed    6 weeks     Allergies as of 12/25/2019      Reactions   Oxycodone Itching      Medication List    STOP taking these medications   naproxen 500 MG tablet Commonly known as: NAPROSYN   norethindrone 5 MG tablet Commonly known as: AYGESTIN     TAKE these medications   acetaminophen 500 MG tablet Commonly known as: TYLENOL Take 1 tablet (500 mg total) by mouth every 6 (six) hours as needed.   albuterol 108 (90 Base) MCG/ACT inhaler Commonly known as: VENTOLIN HFA Inhale  2 puffs into the lungs every 4 (four) hours as needed for wheezing or shortness of breath.   ALPRAZolam 0.25 MG tablet Commonly known as: XANAX Take 1 tablet by mouth 2 (two) times daily as needed.   azelastine 0.1 % nasal spray Commonly known as: ASTELIN Place 2 sprays into both nostrils 2 (two) times daily. What changed: additional instructions   Cholecalciferol 1.25 MG (50000 UT) capsule Take 1 capsule by mouth daily.   cyclobenzaprine 10 MG tablet Commonly known as: FLEXERIL Take 10 mg by mouth 3 (three) times daily as needed for muscle spasms.   docusate sodium 100 MG capsule Commonly known as: COLACE Take 1 capsule (100 mg total) by mouth 2 (two) times daily.   EPINEPHrine 0.3 mg/0.3 mL Soaj injection Commonly  known as: Auvi-Q Use as directed for severe allergic reaction   estradiol 2 MG tablet Commonly known as: ESTRACE Take 2 mg by mouth daily.   fluticasone 50 MCG/ACT nasal spray Commonly known as: Flonase Place 2 sprays into both nostrils daily. As needed.   HYDROmorphone 2 MG tablet Commonly known as: DILAUDID Take 0.5 tablets (1 mg total) by mouth every 6 (six) hours as needed for up to 5 days for severe pain ((when tolerating fluids)). What changed:   how much to take  reasons to take this   ibuprofen 800 MG tablet Commonly known as: ADVIL Take 1 tablet (800 mg total) by mouth every 6 (six) hours. What changed:   medication strength  how much to take  how to take this  when to take this  additional instructions   levocetirizine 5 MG tablet Commonly known as: XYZAL Take 1 tablet (5 mg total) by mouth every evening.   mometasone 50 MCG/ACT nasal spray Commonly known as: NASONEX Place 2 sprays into the nose daily as needed (congestion).   Mometasone Furoate 100 MCG/ACT Aero Commonly known as: Asmanex HFA Inhale 2 puffs into the lungs 2 (two) times a day. What changed: additional instructions   Asmanex HFA 100 MCG/ACT Aero Generic drug: Mometasone Furoate Inhale 2 puffs into the lungs 2 (two) times daily. What changed: Another medication with the same name was changed. Make sure you understand how and when to take each.   montelukast 10 MG tablet Commonly known as: SINGULAIR Take 1 tablet (10 mg total) by mouth at bedtime.   MULTIVITAMIN ADULT EXTRA C PO multivitamin   Olopatadine HCl 0.2 % Soln Commonly known as: Pataday Place 1 drop into both eyes daily as needed.   pantoprazole 40 MG tablet Commonly known as: PROTONIX Take 1 tablet (40 mg total) by mouth 2 (two) times daily. What changed: when to take this   simethicone 80 MG chewable tablet Commonly known as: MYLICON Chew 1 tablet (80 mg total) by mouth 4 (four) times daily as needed for  flatulence.   terconazole 0.4 % vaginal cream Commonly known as: TERAZOL 7 Place 1 applicator vaginally at bedtime as needed.            Discharge Care Instructions  (From admission, onward)         Start     Ordered   12/25/19 0000  Discharge wound care:       Comments: Remove Honeycomb dressing on 12/30/2019   12/25/19 1700          Follow-up Information    Azucena Fallen, MD Follow up in 3 week(s).   Specialty: Obstetrics and Gynecology Contact information: [place] [address] [phone]  SignedElveria Royals 12/25/2019, 9:34 AM

## 2019-12-25 NOTE — Progress Notes (Signed)
2 Days Post-Op Procedure(s) (LRB): ABDOMINAL MYOMECTOMY (N/A) POD #2   Subjective: Patient reports Feeling better today, gas pain, asking for pain med. Passing some flatus, Tolerating regular diet.  Vag bleeding is light.  Denies chest pain, shortness of breath, dizziness.  Objective: I have reviewed patient's vital signs, intake and output, medications and labs. BP 117/69 (BP Location: Left Arm)   Pulse 69   Temp 98.2 F (36.8 C) (Oral)   Resp 14   Ht 5\' 7"  (1.702 m)   Wt 80.5 kg   LMP 12/07/2019 Comment: continual bleeding  SpO2 99%   BMI 27.80 kg/m   General: alert and cooperative Resp: clear to auscultation bilaterally Cardio: regular rate and rhythm, S1, S2 normal, no murmur, click, rub or gallop GI: soft, non-tender; bowel sounds normal; no masses,  no organomegaly and incision: clean, dry and intact Extremities: extremities normal, atraumatic, no cyanosis or edema and Homans sign is negative, no sign of DVT vag bleeding decreased per pt   CBC Latest Ref Rng & Units 12/24/2019 12/21/2019 09/30/2019  WBC 4.0 - 10.5 K/uL 13.7(H) 4.8 4.8  Hemoglobin 12.0 - 15.0 g/dL 9.8(L) 12.2 11.1(L)  Hematocrit 36 - 46 % 30.2(L) 39.3 36.9  Platelets 150 - 400 K/uL 269 268 291    Assessment: s/p Procedure(s): ABDOMINAL MYOMECTOMY (N/A): stable, progressing well and tolerating diet  Plan: Discharge home Post op care and surgical findings discussed, continue Estrace PO, HOLD megace  Post-op pain mngnt, gas pain mngmt discussed F/up with me in 2 weeks and call as needed    LOS: 2 days    Lori Carson 12/25/2019, 9:21 AM

## 2019-12-27 LAB — SURGICAL PATHOLOGY

## 2019-12-27 NOTE — Anesthesia Postprocedure Evaluation (Signed)
Anesthesia Post Note  Patient: Lori Carson  Procedure(s) Performed: ABDOMINAL MYOMECTOMY (N/A Abdomen)     Patient location during evaluation: PACU Anesthesia Type: General Level of consciousness: awake and alert Pain management: pain level controlled Vital Signs Assessment: post-procedure vital signs reviewed and stable Respiratory status: spontaneous breathing, nonlabored ventilation and respiratory function stable Cardiovascular status: blood pressure returned to baseline and stable Postop Assessment: no apparent nausea or vomiting Anesthetic complications: no   No complications documented.  Last Vitals:  Vitals:   12/24/19 2106 12/25/19 0526  BP: 128/83 117/69  Pulse: 81 69  Resp: 16 14  Temp:  36.8 C  SpO2: 100% 99%    Last Pain:  Vitals:   12/25/19 0526  TempSrc: Oral  PainSc:                  Audry Pili

## 2020-01-04 ENCOUNTER — Inpatient Hospital Stay: Payer: PRIVATE HEALTH INSURANCE

## 2020-01-04 ENCOUNTER — Inpatient Hospital Stay: Payer: PRIVATE HEALTH INSURANCE | Admitting: Internal Medicine

## 2020-01-25 ENCOUNTER — Inpatient Hospital Stay: Payer: PRIVATE HEALTH INSURANCE

## 2020-01-25 ENCOUNTER — Telehealth: Payer: Self-pay | Admitting: *Deleted

## 2020-01-25 ENCOUNTER — Inpatient Hospital Stay: Payer: PRIVATE HEALTH INSURANCE | Admitting: Internal Medicine

## 2020-01-25 NOTE — Telephone Encounter (Signed)
Called pt regarding appt today.  She forgot.  Message to schedulers to r/s.

## 2020-02-04 ENCOUNTER — Inpatient Hospital Stay: Payer: PRIVATE HEALTH INSURANCE | Attending: Physician Assistant

## 2020-02-04 ENCOUNTER — Inpatient Hospital Stay (HOSPITAL_BASED_OUTPATIENT_CLINIC_OR_DEPARTMENT_OTHER): Payer: PRIVATE HEALTH INSURANCE | Admitting: Internal Medicine

## 2020-02-04 ENCOUNTER — Encounter: Payer: Self-pay | Admitting: Internal Medicine

## 2020-02-04 ENCOUNTER — Other Ambulatory Visit: Payer: Self-pay

## 2020-02-04 ENCOUNTER — Other Ambulatory Visit: Payer: Self-pay | Admitting: Internal Medicine

## 2020-02-04 ENCOUNTER — Telehealth: Payer: Self-pay | Admitting: Internal Medicine

## 2020-02-04 VITALS — BP 128/83 | HR 60 | Temp 98.8°F | Resp 16 | Ht 67.0 in | Wt 179.4 lb

## 2020-02-04 DIAGNOSIS — D500 Iron deficiency anemia secondary to blood loss (chronic): Secondary | ICD-10-CM | POA: Diagnosis not present

## 2020-02-04 DIAGNOSIS — N920 Excessive and frequent menstruation with regular cycle: Secondary | ICD-10-CM | POA: Diagnosis present

## 2020-02-04 LAB — IRON AND TIBC
Iron: 16 ug/dL — ABNORMAL LOW (ref 41–142)
Saturation Ratios: 4 % — ABNORMAL LOW (ref 21–57)
TIBC: 424 ug/dL (ref 236–444)
UIBC: 408 ug/dL — ABNORMAL HIGH (ref 120–384)

## 2020-02-04 LAB — CBC WITH DIFFERENTIAL (CANCER CENTER ONLY)
Abs Immature Granulocytes: 0.01 10*3/uL (ref 0.00–0.07)
Basophils Absolute: 0 10*3/uL (ref 0.0–0.1)
Basophils Relative: 0 %
Eosinophils Absolute: 0.3 10*3/uL (ref 0.0–0.5)
Eosinophils Relative: 4 %
HCT: 35.3 % — ABNORMAL LOW (ref 36.0–46.0)
Hemoglobin: 11.2 g/dL — ABNORMAL LOW (ref 12.0–15.0)
Immature Granulocytes: 0 %
Lymphocytes Relative: 40 %
Lymphs Abs: 2.6 10*3/uL (ref 0.7–4.0)
MCH: 26.4 pg (ref 26.0–34.0)
MCHC: 31.7 g/dL (ref 30.0–36.0)
MCV: 83.3 fL (ref 80.0–100.0)
Monocytes Absolute: 0.5 10*3/uL (ref 0.1–1.0)
Monocytes Relative: 8 %
Neutro Abs: 3 10*3/uL (ref 1.7–7.7)
Neutrophils Relative %: 48 %
Platelet Count: 355 10*3/uL (ref 150–400)
RBC: 4.24 MIL/uL (ref 3.87–5.11)
RDW: 14 % (ref 11.5–15.5)
WBC Count: 6.4 10*3/uL (ref 4.0–10.5)
nRBC: 0 % (ref 0.0–0.2)

## 2020-02-04 LAB — FERRITIN: Ferritin: 5 ng/mL — ABNORMAL LOW (ref 11–307)

## 2020-02-04 NOTE — Telephone Encounter (Signed)
Scheduled per sch msg. Called and spoke with patient. Confirmed appts. Will call back once 10/15 infusion  is approved

## 2020-02-04 NOTE — Progress Notes (Signed)
[facility] Telephone:([phone]) [phone]   Fax:(336) [phone]  OFFICE PROGRESS NOTE  Patient, No Pcp Per No address on file  DIAGNOSIS: Iron deficiency anemia secondary to menorrhagia.  PRIOR THERAPY: Venofer infusion for 4 weeks started on 08/20/2019.  CURRENT THERAPY: Over-the-counter oral iron supplements 2 times daily.  INTERVAL HISTORY: Lori Carson 29 y.o. female returns to the clinic today for follow-up visit.  The patient is feeling fine today with no concerning complaints except for mild fatigue.  The patient underwent myomectomy on 12/23/2019.  Her hemoglobin and hematocrit were down at that time but she did not receive any blood transfusion.  She continues on the oral iron tablet but she takes it every other day.  She denied having any current chest pain, shortness of breath, cough or hemoptysis.  She denied having any fever or chills.  She has no nausea, vomiting, diarrhea or constipation.  She has no headache or visual changes.  She started having vaginal bleeding again and she is seeing her gynecologist later today for evaluation.  The patient is here today for evaluation with repeat CBC, iron study and ferritin.   MEDICAL HISTORY: Past Medical History:  Diagnosis Date  . Anemia    borderline  . Asthma   . Chest pain    left arm numbness but can take tums and goes away  . Fibroids   . GERD (gastroesophageal reflux disease)     ALLERGIES:  is allergic to oxycodone.  MEDICATIONS:  Current Outpatient Medications  Medication Sig Dispense Refill  . acetaminophen (TYLENOL) 500 MG tablet Take 1 tablet (500 mg total) by mouth every 6 (six) hours as needed. 30 tablet 0  . albuterol (VENTOLIN HFA) 108 (90 Base) MCG/ACT inhaler Inhale 2 puffs into the lungs every 4 (four) hours as needed for wheezing or shortness of breath. 18 g 3  . ALPRAZolam (XANAX) 0.25 MG tablet Take 1 tablet by mouth 2 (two) times daily as needed. (Patient not taking: Reported on  12/13/2019)    . azelastine (ASTELIN) 0.1 % nasal spray Place 2 sprays into both nostrils 2 (two) times daily. (Patient taking differently: Place 2 sprays into both nostrils 2 (two) times daily. As needed.) 30 mL 5  . Cholecalciferol 1.25 MG (50000 UT) capsule Take 1 capsule by mouth daily. (Patient not taking: Reported on 12/13/2019)    . cyclobenzaprine (FLEXERIL) 10 MG tablet Take 10 mg by mouth 3 (three) times daily as needed for muscle spasms.     Marland Kitchen docusate sodium (COLACE) 100 MG capsule Take 1 capsule (100 mg total) by mouth 2 (two) times daily. 10 capsule 0  . EPINEPHrine (AUVI-Q) 0.3 mg/0.3 mL IJ SOAJ injection Use as directed for severe allergic reaction (Patient not taking: Reported on 12/13/2019) 2 each 2  . estradiol (ESTRACE) 2 MG tablet Take 2 mg by mouth daily.    . fluticasone (FLONASE) 50 MCG/ACT nasal spray Place 2 sprays into both nostrils daily. As needed. 1 g 5  . ibuprofen (ADVIL) 800 MG tablet Take 1 tablet (800 mg total) by mouth every 6 (six) hours. 30 tablet 0  . levocetirizine (XYZAL) 5 MG tablet Take 1 tablet (5 mg total) by mouth every evening. (Patient not taking: Reported on 12/13/2019) 30 tablet 5  . mometasone (NASONEX) 50 MCG/ACT nasal spray Place 2 sprays into the nose daily as needed (congestion). (Patient not taking: Reported on 12/13/2019)    . Mometasone Furoate (ASMANEX HFA) 100 MCG/ACT AERO Inhale 2  puffs into the lungs 2 (two) times a day. (Patient taking differently: Inhale 2 puffs into the lungs 2 (two) times a day. Using the sample given as needed.) 13 g 5  . Mometasone Furoate (ASMANEX HFA) 100 MCG/ACT AERO Inhale 2 puffs into the lungs 2 (two) times daily. (Patient not taking: Reported on 12/13/2019) 13 g 5  . montelukast (SINGULAIR) 10 MG tablet Take 1 tablet (10 mg total) by mouth at bedtime. 30 tablet 5  . Multiple Vitamins-Minerals (MULTIVITAMIN ADULT EXTRA C PO) multivitamin (Patient not taking: Reported on 12/13/2019)    . Olopatadine HCl (PATADAY) 0.2 %  SOLN Place 1 drop into both eyes daily as needed. (Patient not taking: Reported on 08/27/2019) 1 Bottle 5  . pantoprazole (PROTONIX) 40 MG tablet Take 1 tablet (40 mg total) by mouth 2 (two) times daily. (Patient taking differently: Take 40 mg by mouth daily. ) 60 tablet 5  . simethicone (MYLICON) 80 MG chewable tablet Chew 1 tablet (80 mg total) by mouth 4 (four) times daily as needed for flatulence. 30 tablet 0  . terconazole (TERAZOL 7) 0.4 % vaginal cream Place 1 applicator vaginally at bedtime as needed. (Patient not taking: Reported on 12/13/2019)     No current facility-administered medications for this visit.    SURGICAL HISTORY:  Past Surgical History:  Procedure Laterality Date  . MYOMECTOMY N/A 07/08/2016   Procedure: MYOMECTOMY with peritoneal biopsy;  Surgeon: Everett Graff, MD;  Location: [facility];  Service: Gynecology;  Laterality: N/A;  . MYOMECTOMY N/A 12/23/2019   Procedure: ABDOMINAL MYOMECTOMY;  Surgeon: Azucena Fallen, MD;  [facility];  Service: Gynecology;  Laterality: N/A;  . OVARIAN CYST REMOVAL N/A 07/08/2016   Procedure: OVARIAN CYSTECTOMY right, left paratubal cystectomy;  Surgeon: Everett Graff, MD;  Location: [facility];  Service: Gynecology;  Laterality: N/A;  . TONSILLECTOMY    . UPPER GI ENDOSCOPY    . WISDOM TOOTH EXTRACTION      REVIEW OF SYSTEMS:  A comprehensive review of systems was negative except for: Constitutional: positive for fatigue   PHYSICAL EXAMINATION: General appearance: alert, cooperative, fatigued and no distress Head: Normocephalic, without obvious abnormality, atraumatic Neck: no adenopathy, no JVD, supple, symmetrical, trachea midline and thyroid not enlarged, symmetric, no tenderness/mass/nodules Lymph nodes: Cervical, supraclavicular, and axillary nodes normal. Resp: clear to auscultation bilaterally Back: symmetric, no curvature. ROM normal. No CVA tenderness. Cardio: regular rate and rhythm, S1, S2 normal, no murmur, click, rub or  gallop GI: soft, non-tender; bowel sounds normal; no masses,  no organomegaly Extremities: extremities normal, atraumatic, no cyanosis or edema  ECOG PERFORMANCE STATUS: 1 - Symptomatic but completely ambulatory  Blood pressure 128/83, pulse 60, temperature 98.8 F (37.1 C), resp. rate 16, height 5\' 7"  (1.702 m), weight 179 lb 6.4 oz (81.4 kg), SpO2 100 %.  LABORATORY DATA: Lab Results  Component Value Date   WBC 13.7 (H) 12/24/2019   HGB 9.8 (L) 12/24/2019   HCT 30.2 (L) 12/24/2019   MCV 84.4 12/24/2019   PLT 269 12/24/2019      Chemistry      Component Value Date/Time   NA 141 12/21/2019 1019   K 3.6 12/21/2019 1019   CL 110 12/21/2019 1019   CO2 21 (L) 12/21/2019 1019   BUN 6 12/21/2019 1019   CREATININE 0.89 12/21/2019 1019   CREATININE 0.85 08/17/2019 1315      Component Value Date/Time   CALCIUM 8.8 (L) 12/21/2019 1019   ALKPHOS 53 08/17/2019 1315   AST  15 08/17/2019 1315   ALT 8 08/17/2019 1315   BILITOT 0.4 08/17/2019 1315       RADIOGRAPHIC STUDIES: No results found.  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  The patient received iron infusion with Venofer for 4 doses and she felt much better but continues to have mild fatigue. The patient is currently on oral iron tablets but she does not take it regularly and only few times a week.  CBC today showed improvement of her hemoglobin and hematocrit compared to several weeks ago during her hospitalization.  Her iron study and ferritin are still pending  I recommended for the patient to take her iron tablet on a daily basis with vitamin C. After the iron study showed significant iron deficiency, I will arrange for the patient to receive iron infusion again, otherwise she will come back for follow-up visit in 3 months for evaluation with repeat CBC, iron study and ferritin. The patient  was advised to call immediately if she has any concerning symptoms in the interval. The patient voices understanding of current disease status and treatment options and is in agreement with the current care plan.  All questions were answered. The patient knows to call the clinic with any problems, questions or concerns. We can certainly see the patient much sooner if necessary.  Disclaimer: This note was dictated with voice recognition software. Similar sounding words can inadvertently be transcribed and may not be corrected upon review.

## 2020-02-07 ENCOUNTER — Telehealth: Payer: Self-pay | Admitting: Internal Medicine

## 2020-02-07 ENCOUNTER — Telehealth: Payer: Self-pay

## 2020-02-07 NOTE — Telephone Encounter (Signed)
Called and spoke with patient. She can not come in on 10/15 for infusion. Scheduled appt for 11/12. Confirmed with patient.

## 2020-02-07 NOTE — Telephone Encounter (Signed)
Called pt and advised of the information below. Pt voiced understanding of this information.

## 2020-02-07 NOTE — Telephone Encounter (Signed)
-----   Message from Ardeen Garland, RN sent at 02/04/2020  2:11 PM EDT ----- Can you please call the pt. ----- Message ----- From: Curt Bears, MD Sent: 02/04/2020  11:00 AM EDT To: Ardeen Garland, RN, #  I ordered iron infusion for her to start 02/11/2020 for 4 weeks.  I already sent a scheduler message.  Please let the patient know.  Thank you. ----- Message ----- From: Buel Ream, [facility] [place] Sent: 02/04/2020   8:38 AM EDT To: Curt Bears, MD

## 2020-02-11 ENCOUNTER — Ambulatory Visit: Payer: PRIVATE HEALTH INSURANCE

## 2020-02-11 ENCOUNTER — Ambulatory Visit: Payer: PRIVATE HEALTH INSURANCE | Admitting: Allergy

## 2020-02-15 NOTE — Telephone Encounter (Signed)
ERROR

## 2020-02-18 ENCOUNTER — Other Ambulatory Visit: Payer: Self-pay

## 2020-02-18 ENCOUNTER — Inpatient Hospital Stay: Payer: PRIVATE HEALTH INSURANCE

## 2020-02-18 VITALS — BP 118/78 | HR 58 | Temp 98.6°F | Resp 17

## 2020-02-18 DIAGNOSIS — D500 Iron deficiency anemia secondary to blood loss (chronic): Secondary | ICD-10-CM

## 2020-02-18 MED ORDER — SODIUM CHLORIDE 0.9 % IV SOLN
Freq: Once | INTRAVENOUS | Status: AC
  Filled 2020-02-18: qty 250

## 2020-02-18 MED ORDER — DIPHENHYDRAMINE HCL 50 MG/ML IJ SOLN
25.0000 mg | Freq: Once | INTRAMUSCULAR | Status: AC
  Administered 2020-02-18: 25 mg via INTRAVENOUS

## 2020-02-18 MED ORDER — DIPHENHYDRAMINE HCL 50 MG/ML IJ SOLN
INTRAMUSCULAR | Status: AC
  Filled 2020-02-18: qty 1

## 2020-02-18 MED ORDER — ACETAMINOPHEN 325 MG PO TABS
ORAL_TABLET | ORAL | Status: AC
  Filled 2020-02-18: qty 2

## 2020-02-18 MED ORDER — SODIUM CHLORIDE 0.9 % IV SOLN
125.0000 mg | Freq: Once | INTRAVENOUS | Status: AC
  Administered 2020-02-18: 125 mg via INTRAVENOUS
  Filled 2020-02-18: qty 10

## 2020-02-18 MED ORDER — ACETAMINOPHEN 325 MG PO TABS
650.0000 mg | ORAL_TABLET | Freq: Once | ORAL | Status: AC
  Administered 2020-02-18: 650 mg via ORAL

## 2020-02-18 NOTE — Patient Instructions (Signed)

## 2020-02-18 NOTE — Progress Notes (Signed)
Patient received Iron today. Waited her 30 minute post observation with no issues. vitals WNL.

## 2020-02-24 ENCOUNTER — Other Ambulatory Visit: Payer: Self-pay

## 2020-02-24 ENCOUNTER — Ambulatory Visit (INDEPENDENT_AMBULATORY_CARE_PROVIDER_SITE_OTHER): Payer: PRIVATE HEALTH INSURANCE | Admitting: Allergy

## 2020-02-24 ENCOUNTER — Encounter: Payer: Self-pay | Admitting: Allergy

## 2020-02-24 VITALS — BP 116/82 | HR 60 | Temp 98.0°F | Resp 18 | Ht 67.0 in | Wt 182.0 lb

## 2020-02-24 DIAGNOSIS — T781XXD Other adverse food reactions, not elsewhere classified, subsequent encounter: Secondary | ICD-10-CM

## 2020-02-24 DIAGNOSIS — H1013 Acute atopic conjunctivitis, bilateral: Secondary | ICD-10-CM | POA: Diagnosis not present

## 2020-02-24 DIAGNOSIS — J3089 Other allergic rhinitis: Secondary | ICD-10-CM

## 2020-02-24 DIAGNOSIS — T63441D Toxic effect of venom of bees, accidental (unintentional), subsequent encounter: Secondary | ICD-10-CM | POA: Diagnosis not present

## 2020-02-24 DIAGNOSIS — J4520 Mild intermittent asthma, uncomplicated: Secondary | ICD-10-CM | POA: Diagnosis not present

## 2020-02-24 DIAGNOSIS — K219 Gastro-esophageal reflux disease without esophagitis: Secondary | ICD-10-CM

## 2020-02-24 MED ORDER — ALBUTEROL SULFATE HFA 108 (90 BASE) MCG/ACT IN AERS: 2.0000 | INHALATION_SPRAY | RESPIRATORY_TRACT | 3 refills | Status: DC | PRN

## 2020-02-24 MED ORDER — EPINEPHRINE 0.3 MG/0.3ML IJ SOAJ
INTRAMUSCULAR | 2 refills | Status: DC
Start: 2020-02-24 — End: 2021-08-14

## 2020-02-24 MED ORDER — PANTOPRAZOLE SODIUM 40 MG PO TBEC
40.0000 mg | DELAYED_RELEASE_TABLET | Freq: Every day | ORAL | 5 refills | Status: DC
Start: 2020-02-24 — End: 2021-08-14

## 2020-02-24 MED ORDER — FLUTICASONE PROPIONATE 50 MCG/ACT NA SUSP
2.0000 | Freq: Every day | NASAL | 5 refills | Status: DC
Start: 2020-02-24 — End: 2021-08-14

## 2020-02-24 MED ORDER — MONTELUKAST SODIUM 10 MG PO TABS
10.0000 mg | ORAL_TABLET | Freq: Every day | ORAL | 5 refills | Status: DC
Start: 2020-02-24 — End: 2021-08-14

## 2020-02-24 NOTE — Progress Notes (Signed)
Follow-up Note  RE: Lori Carson MRN: [id number] DOB: 1990/07/03 Date of Office Visit: 02/24/2020   History of present illness: Lori Carson is a 30 y.o. female presenting today for follow-up of allergic rhinitis with conjunctivitis, intermittent asthma, pollen food allergy syndrome and reflux.  She was last seen in the office on 08/27/2019 by myself.  She stopped her singulair.  Her allergy symptoms worsened last week and she resumed her singulair and this has helped.  She reports her allergy symptoms are worse in the morning with more nasal drainage.  She is consistent with taking her Flonase daily.  She will use Zyrtec as needed. She reports her albuterol use on average is about once a month.  She is not having any significant daytime or nighttime symptoms and has not required any ED or urgent care visits for her asthma.  She had surgery and has not been as active as she normally would be but plans to start working out again.  She states she normally will need to use her albuterol prior to activity. She is not require any systemic steroids for her breathing since her last visit. She continues to take Protonix for reflux control. She was at [employer] this weekend and states a bee was on her friend and she brushed her arm against him and states felt the sting on her right upper arm.  There was no visible stinger in the skin.  She states swelling and pain at the sting site.  She states she felt like she could pass out but did not.  She denies any respiratory, GI or or cutaneous related symptoms other than swelling at the site.  However also states she was hot and had not had much water that day.she states she was able to continue doing what they were doing and did not require any intervention.  This was the first time she has been stung.  She did not have access to an epinephrine device.  Review of systems: Review of Systems  Constitutional: Negative.   HENT:       See HPI    Eyes: Negative.   Respiratory: Negative.   Cardiovascular: Negative.   Gastrointestinal: Negative.   Musculoskeletal: Negative.   Skin: Negative.   Neurological: Negative.     All other systems negative unless noted above in HPI  Past medical/social/surgical/family history have been reviewed and are unchanged unless specifically indicated below.  No changes  Medication List: Current Outpatient Medications  Medication Sig Dispense Refill  . acetaminophen (TYLENOL) 500 MG tablet Take 1 tablet (500 mg total) by mouth every 6 (six) hours as needed. 30 tablet 0  . albuterol (VENTOLIN HFA) 108 (90 Base) MCG/ACT inhaler Inhale 2 puffs into the lungs every 4 (four) hours as needed for wheezing or shortness of breath. 18 g 3  . ALPRAZolam (XANAX) 0.25 MG tablet Take 1 tablet by mouth 2 (two) times daily as needed.     Marland Kitchen azelastine (ASTELIN) 0.1 % nasal spray Place 2 sprays into both nostrils 2 (two) times daily. (Patient taking differently: Place 2 sprays into both nostrils 2 (two) times daily. As needed.) 30 mL 5  . Cholecalciferol 1.25 MG (50000 UT) capsule Take 1 capsule by mouth daily.     . cyclobenzaprine (FLEXERIL) 10 MG tablet Take 10 mg by mouth 3 (three) times daily as needed for muscle spasms.     Marland Kitchen docusate sodium (COLACE) 100 MG capsule Take 1 capsule (100 mg total) by mouth 2 (two)  times daily. 10 capsule 0  . EPINEPHrine (AUVI-Q) 0.3 mg/0.3 mL IJ SOAJ injection Use as directed for severe allergic reaction 2 each 2  . fluticasone (FLONASE) 50 MCG/ACT nasal spray Place 2 sprays into both nostrils daily. As needed. 1 g 5  . ibuprofen (ADVIL) 800 MG tablet Take 1 tablet (800 mg total) by mouth every 6 (six) hours. 30 tablet 0  . mometasone (NASONEX) 50 MCG/ACT nasal spray Place 2 sprays into the nose daily as needed (congestion).     . Mometasone Furoate (ASMANEX HFA) 100 MCG/ACT AERO Inhale 2 puffs into the lungs 2 (two) times a day. (Patient taking differently: Inhale 2 puffs into  the lungs 2 (two) times a day. Using the sample given as needed.) 13 g 5  . Mometasone Furoate (ASMANEX HFA) 100 MCG/ACT AERO Inhale 2 puffs into the lungs 2 (two) times daily. 13 g 5  . montelukast (SINGULAIR) 10 MG tablet Take 1 tablet (10 mg total) by mouth at bedtime. 30 tablet 5  . Multiple Vitamins-Minerals (MULTIVITAMIN ADULT EXTRA C PO) multivitamin    . Olopatadine HCl (PATADAY) 0.2 % SOLN Place 1 drop into both eyes daily as needed. 1 Bottle 5  . pantoprazole (PROTONIX) 40 MG tablet Take 1 tablet (40 mg total) by mouth daily. 30 tablet 5  . terconazole (TERAZOL 7) 0.4 % vaginal cream Place 1 applicator vaginally at bedtime as needed.     Marland Kitchen estradiol (ESTRACE) 2 MG tablet Take 2 mg by mouth daily. (Patient not taking: Reported on 02/24/2020)    . levocetirizine (XYZAL) 5 MG tablet Take 1 tablet (5 mg total) by mouth every evening. (Patient not taking: Reported on 02/24/2020) 30 tablet 5  . simethicone (MYLICON) 80 MG chewable tablet Chew 1 tablet (80 mg total) by mouth 4 (four) times daily as needed for flatulence. (Patient not taking: Reported on 02/24/2020) 30 tablet 0   No current facility-administered medications for this visit.     Known medication allergies: Allergies  Allergen Reactions  . Oxycodone Itching     Physical examination: Blood pressure 116/82, pulse 60, temperature 98 F (36.7 C), temperature source Temporal, resp. rate 18, height 5\' 7"  (1.702 m), weight 182 lb (82.6 kg), SpO2 100 %.  General: Alert, interactive, in no acute distress. HEENT: TMs pearly gray, turbinates minimally edematous without discharge, post-pharynx non erythematous. Neck: Supple without lymphadenopathy. Lungs: Clear to auscultation without wheezing, rhonchi or rales. {no increased work of breathing. CV: Normal S1, S2 without murmurs. Abdomen: Nondistended, nontender. Skin: A tiny pinpoint lesion of the sting site on her right upper lateral arm. Extremities:  No clubbing, cyanosis or  edema. Neuro:   Grossly intact.  Diagnositics/Labs: None today  Assessment and plan:   Allergic rhinitis with conjunctivitis  - continue avoidance measures for grasses, trees, weeds, molds, dust mites, cat, dog, horse, cockroach, mouse.  - recommend use of nasal saline rinse as needed.  Use distilled water or boil water and bring to room temp prior to use.  Use prior to using her medicated nasal sprays  - use Astelin 2 sprays each nostril twice a day as needed to help with nasal drainage/post-nasal drip    - continue Flonase 2 sprays each nostril daily as needed for nasal congestion.  Use for 1-2 weeks at a time before stopping once symptoms improve.    - use Xyzal 5mg  daily as needed   - continue Singulair 10 mg daily   - use Pataday 1 drop each eye as  needed for itchy, watery, red eyes  Intermittent asthma  - continue as needed use of albuterol inhaler  2 puffs or nebulizer 1 vial every 4-6 hours for cough, wheeze, difficulty breathing or chest tightness. Monitor frequency of use.   - continue Singulair as above.  Asthma control goals:   Full participation in all desired activities (may need albuterol before activity)  Albuterol use two time or less a week on average (not counting use with activity)  Cough interfering with sleep two time or less a month  Oral steroids no more than once a year  No hospitalizations  Food allergy/pollen oral food syndrome - Food testing for green pea is was positive on previous testing.   Would continue avoidance at this time   Reflux  - continue pantoprazole 40mg  daily  Bee Sting  - concern that recent sting may have caused reaction with lightheadedness   - will obtain hymenoptera panel to see if you have a sensitivity as well as a tryptase level  - do your best to avoidance future stings  - have access to self-injectable epinephrine (Epipen or AuviQ) 0.3mg  at all times  - follow emergency action plan in case of allergic  reaction  follow-up in 6 months or sooner if needed  I appreciate the opportunity to take part in Lori Carson's care. Please do not hesitate to contact me with questions.  Sincerely,   Prudy Feeler, MD Allergy/Immunology [facility] [place] [facility]

## 2020-02-24 NOTE — Patient Instructions (Addendum)
Allergic rhinitis with conjunctivitis  - continue avoidance measures for grasses, trees, weeds, molds, dust mites, cat, dog, horse, cockroach, mouse.  - recommend use of nasal saline rinse as needed.  Use distilled water or boil water and bring to room temp prior to use.  Use prior to using her medicated nasal sprays  - use Astelin 2 sprays each nostril twice a day as needed to help with nasal drainage/post-nasal drip    - continue Flonase 2 sprays each nostril daily as needed for nasal congestion.  Use for 1-2 weeks at a time before stopping once symptoms improve.    - use Xyzal 5mg  daily as needed   - continue Singulair 10 mg daily   - use Pataday 1 drop each eye as needed for itchy, watery, red eyes  Intermittent asthma  - continue as needed use of albuterol inhaler  2 puffs or nebulizer 1 vial every 4-6 hours for cough, wheeze, difficulty breathing or chest tightness. Monitor frequency of use.   - continue Singulair as above.  Asthma control goals:   Full participation in all desired activities (may need albuterol before activity)  Albuterol use two time or less a week on average (not counting use with activity)  Cough interfering with sleep two time or less a month  Oral steroids no more than once a year  No hospitalizations  Food allergy/pollen oral food syndrome - Food testing for green pea is was positive on previous testing.   Would continue avoidance at this time   Reflux  - continue pantoprazole 40mg  daily  Bee Sting  - concern that recent sting may have caused reaction with lightheadedness   - will obtain hymenoptera panel to see if you have a sensitivity  - do your best to avoidance future stings  - have access to self-injectable epinephrine (Epipen or AuviQ) 0.3mg  at all times  - follow emergency action plan in case of allergic reaction   follow-up in 6 months or sooner if needed

## 2020-02-25 ENCOUNTER — Inpatient Hospital Stay: Payer: PRIVATE HEALTH INSURANCE

## 2020-02-25 ENCOUNTER — Other Ambulatory Visit: Payer: Self-pay

## 2020-02-25 VITALS — BP 114/62 | HR 54 | Temp 98.4°F | Resp 17

## 2020-02-25 DIAGNOSIS — D500 Iron deficiency anemia secondary to blood loss (chronic): Secondary | ICD-10-CM | POA: Diagnosis not present

## 2020-02-25 MED ORDER — SODIUM CHLORIDE 0.9 % IV SOLN
Freq: Once | INTRAVENOUS | Status: AC
  Filled 2020-02-25: qty 250

## 2020-02-25 MED ORDER — ACETAMINOPHEN 325 MG PO TABS
ORAL_TABLET | ORAL | Status: AC
  Filled 2020-02-25: qty 2

## 2020-02-25 MED ORDER — DIPHENHYDRAMINE HCL 50 MG/ML IJ SOLN
INTRAMUSCULAR | Status: AC
  Filled 2020-02-25: qty 1

## 2020-02-25 MED ORDER — ACETAMINOPHEN 325 MG PO TABS
650.0000 mg | ORAL_TABLET | Freq: Once | ORAL | Status: AC
  Administered 2020-02-25: 650 mg via ORAL

## 2020-02-25 MED ORDER — SODIUM CHLORIDE 0.9 % IV SOLN
125.0000 mg | Freq: Once | INTRAVENOUS | Status: AC
  Administered 2020-02-25: 125 mg via INTRAVENOUS
  Filled 2020-02-25: qty 10

## 2020-02-25 MED ORDER — DIPHENHYDRAMINE HCL 50 MG/ML IJ SOLN
25.0000 mg | Freq: Once | INTRAMUSCULAR | Status: AC
  Administered 2020-02-25: 25 mg via INTRAVENOUS

## 2020-02-25 NOTE — Patient Instructions (Signed)
Sodium Ferric Gluconate Complex injection What is this medicine? SODIUM FERRIC GLUCONATE COMPLEX (SOE dee um FER ik GLOO koe nate KOM pleks) is an iron replacement. It is used with epoetin therapy to treat low iron levels in patients who are receiving hemodialysis. This medicine may be used for other purposes; ask your health care provider or pharmacist if you have questions. COMMON BRAND NAME(S): Ferrlecit, Nulecit What should I tell my health care provider before I take this medicine? They need to know if you have any of the following conditions:  anemia that is not from iron deficiency  high levels of iron in the body  an unusual or allergic reaction to iron, benzyl alcohol, other medicines, foods, dyes, or preservatives  pregnant or are trying to become pregnant  breast-feeding How should I use this medicine? This medicine is for infusion into a vein. It is given by a health care professional in a hospital or clinic setting. Talk to your pediatrician regarding the use of this medicine in children. While this drug may be prescribed for children as young as 6 years old for selected conditions, precautions do apply. Overdosage: If you think you have taken too much of this medicine contact a poison control center or emergency room at once. NOTE: This medicine is only for you. Do not share this medicine with others. What if I miss a dose? It is important not to miss your dose. Call your doctor or health care professional if you are unable to keep an appointment. What may interact with this medicine? Do not take this medicine with any of the following medications:  deferoxamine  dimercaprol  other iron products This medicine may also interact with the following medications:  chloramphenicol  deferasirox  medicine for blood pressure like enalapril This list may not describe all possible interactions. Give your health care provider a list of all the medicines, herbs,  non-prescription drugs, or dietary supplements you use. Also tell them if you smoke, drink alcohol, or use illegal drugs. Some items may interact with your medicine. What should I watch for while using this medicine? Your condition will be monitored carefully while you are receiving this medicine. Visit your doctor for check-ups as directed. What side effects may I notice from receiving this medicine? Side effects that you should report to your doctor or health care professional as soon as possible:  allergic reactions like skin rash, itching or hives, swelling of the face, lips, or tongue  breathing problems  changes in hearing  changes in vision  chills, flushing, or sweating  fast, irregular heartbeat  feeling faint or lightheaded, falls  fever, flu-like symptoms  high or low blood pressure  pain, tingling, numbness in the hands or feet  severe pain in the chest, back, flanks, or groin  swelling of the ankles, feet, hands  trouble passing urine or change in the amount of urine  unusually weak or tired Side effects that usually do not require medical attention (report to your doctor or health care professional if they continue or are bothersome):  cramps  dark colored stools  diarrhea  headache  nausea, vomiting  stomach upset This list may not describe all possible side effects. Call your doctor for medical advice about side effects. You may report side effects to FDA at 1-800-FDA-1088. Where should I keep my medicine? This drug is given in a hospital or clinic and will not be stored at home. NOTE: This sheet is a summary. It may not cover all   possible information. If you have questions about this medicine, talk to your doctor, pharmacist, or health care provider.  2020 Elsevier/Gold Standard (2007-12-16 15:58:57)  

## 2020-02-27 LAB — ALLERGEN HYMENOPTERA PANEL
Bumblebee: <0.1 kU/L
Honeybee IgE: <0.1 kU/L
Hornet, White Face, IgE: <0.1 kU/L
Hornet, Yellow, IgE: <0.1 kU/L
Paper Wasp IgE: <0.1 kU/L
Yellow Jacket, IgE: <0.1 kU/L

## 2020-02-27 LAB — TRYPTASE: Tryptase: 7.4 ug/L (ref 2.2–13.2)

## 2020-02-29 ENCOUNTER — Telehealth: Payer: Self-pay

## 2020-02-29 NOTE — Telephone Encounter (Signed)
Pt is aware. She states that she still has a bump where she was stung. What should she do?

## 2020-02-29 NOTE — Telephone Encounter (Signed)
-----   Message from Kennith Gain, MD sent at 02/29/2020 10:54 AM EDT ----- Please let patient know the following: -Stinging insect panel is negative.   -Tryptase level is normal.  She does not have "hyperreactive" allergy cells  Thus it is most likely that her feeling hot and lightheaded likely due from the heat and/or dehydration

## 2020-02-29 NOTE — Telephone Encounter (Signed)
Can try heating and massaging the area if not doing so already.

## 2020-02-29 NOTE — Telephone Encounter (Signed)
Patient informed. 

## 2020-03-03 ENCOUNTER — Other Ambulatory Visit: Payer: Self-pay

## 2020-03-03 ENCOUNTER — Inpatient Hospital Stay: Payer: PRIVATE HEALTH INSURANCE | Attending: Physician Assistant

## 2020-03-03 VITALS — BP 122/66 | HR 53 | Temp 98.2°F | Resp 16

## 2020-03-03 DIAGNOSIS — D500 Iron deficiency anemia secondary to blood loss (chronic): Secondary | ICD-10-CM | POA: Diagnosis not present

## 2020-03-03 DIAGNOSIS — N920 Excessive and frequent menstruation with regular cycle: Secondary | ICD-10-CM | POA: Insufficient documentation

## 2020-03-03 MED ORDER — DIPHENHYDRAMINE HCL 50 MG/ML IJ SOLN
25.0000 mg | Freq: Once | INTRAMUSCULAR | Status: AC
  Administered 2020-03-03: 25 mg via INTRAVENOUS

## 2020-03-03 MED ORDER — ACETAMINOPHEN 325 MG PO TABS
650.0000 mg | ORAL_TABLET | Freq: Once | ORAL | Status: AC
  Administered 2020-03-03: 650 mg via ORAL

## 2020-03-03 MED ORDER — SODIUM CHLORIDE 0.9 % IV SOLN
125.0000 mg | Freq: Once | INTRAVENOUS | Status: AC
  Administered 2020-03-03: 125 mg via INTRAVENOUS
  Filled 2020-03-03: qty 10

## 2020-03-03 MED ORDER — SODIUM CHLORIDE 0.9 % IV SOLN
Freq: Once | INTRAVENOUS | Status: AC
  Filled 2020-03-03: qty 250

## 2020-03-03 NOTE — Patient Instructions (Signed)
Sodium Ferric Gluconate Complex injection What is this medicine? SODIUM FERRIC GLUCONATE COMPLEX (SOE dee um FER ik GLOO koe nate KOM pleks) is an iron replacement. It is used with epoetin therapy to treat low iron levels in patients who are receiving hemodialysis. This medicine may be used for other purposes; ask your health care provider or pharmacist if you have questions. COMMON BRAND NAME(S): Ferrlecit, Nulecit What should I tell my health care provider before I take this medicine? They need to know if you have any of the following conditions:  anemia that is not from iron deficiency  high levels of iron in the body  an unusual or allergic reaction to iron, benzyl alcohol, other medicines, foods, dyes, or preservatives  pregnant or are trying to become pregnant  breast-feeding How should I use this medicine? This medicine is for infusion into a vein. It is given by a health care professional in a hospital or clinic setting. Talk to your pediatrician regarding the use of this medicine in children. While this drug may be prescribed for children as young as 6 years old for selected conditions, precautions do apply. Overdosage: If you think you have taken too much of this medicine contact a poison control center or emergency room at once. NOTE: This medicine is only for you. Do not share this medicine with others. What if I miss a dose? It is important not to miss your dose. Call your doctor or health care professional if you are unable to keep an appointment. What may interact with this medicine? Do not take this medicine with any of the following medications:  deferoxamine  dimercaprol  other iron products This medicine may also interact with the following medications:  chloramphenicol  deferasirox  medicine for blood pressure like enalapril This list may not describe all possible interactions. Give your health care provider a list of all the medicines, herbs,  non-prescription drugs, or dietary supplements you use. Also tell them if you smoke, drink alcohol, or use illegal drugs. Some items may interact with your medicine. What should I watch for while using this medicine? Your condition will be monitored carefully while you are receiving this medicine. Visit your doctor for check-ups as directed. What side effects may I notice from receiving this medicine? Side effects that you should report to your doctor or health care professional as soon as possible:  allergic reactions like skin rash, itching or hives, swelling of the face, lips, or tongue  breathing problems  changes in hearing  changes in vision  chills, flushing, or sweating  fast, irregular heartbeat  feeling faint or lightheaded, falls  fever, flu-like symptoms  high or low blood pressure  pain, tingling, numbness in the hands or feet  severe pain in the chest, back, flanks, or groin  swelling of the ankles, feet, hands  trouble passing urine or change in the amount of urine  unusually weak or tired Side effects that usually do not require medical attention (report to your doctor or health care professional if they continue or are bothersome):  cramps  dark colored stools  diarrhea  headache  nausea, vomiting  stomach upset This list may not describe all possible side effects. Call your doctor for medical advice about side effects. You may report side effects to FDA at 1-800-FDA-1088. Where should I keep my medicine? This drug is given in a hospital or clinic and will not be stored at home. NOTE: This sheet is a summary. It may not cover all   possible information. If you have questions about this medicine, talk to your doctor, pharmacist, or health care provider.  2020 Elsevier/Gold Standard (2007-12-16 15:58:57)  

## 2020-03-03 NOTE — Progress Notes (Signed)
Patient stayed for 15 minute post observation from iron infusion, declined the following 15 min.  VS obtained, discharged with no distress.

## 2020-03-10 ENCOUNTER — Other Ambulatory Visit: Payer: Self-pay

## 2020-03-10 ENCOUNTER — Inpatient Hospital Stay: Payer: PRIVATE HEALTH INSURANCE

## 2020-03-10 VITALS — BP 117/77 | HR 63 | Temp 98.2°F | Resp 16

## 2020-03-10 DIAGNOSIS — D500 Iron deficiency anemia secondary to blood loss (chronic): Secondary | ICD-10-CM | POA: Diagnosis not present

## 2020-03-10 MED ORDER — DIPHENHYDRAMINE HCL 50 MG/ML IJ SOLN
25.0000 mg | Freq: Once | INTRAMUSCULAR | Status: AC
  Administered 2020-03-10: 25 mg via INTRAVENOUS

## 2020-03-10 MED ORDER — ACETAMINOPHEN 325 MG PO TABS
650.0000 mg | ORAL_TABLET | Freq: Once | ORAL | Status: AC
  Administered 2020-03-10: 650 mg via ORAL

## 2020-03-10 MED ORDER — ACETAMINOPHEN 325 MG PO TABS
ORAL_TABLET | ORAL | Status: AC
  Filled 2020-03-10: qty 2

## 2020-03-10 MED ORDER — DIPHENHYDRAMINE HCL 50 MG/ML IJ SOLN
INTRAMUSCULAR | Status: AC
  Filled 2020-03-10: qty 1

## 2020-03-10 MED ORDER — SODIUM CHLORIDE 0.9 % IV SOLN
Freq: Once | INTRAVENOUS | Status: AC
  Filled 2020-03-10: qty 250

## 2020-03-10 MED ORDER — SODIUM CHLORIDE 0.9 % IV SOLN
125.0000 mg | Freq: Once | INTRAVENOUS | Status: AC
  Administered 2020-03-10: 125 mg via INTRAVENOUS
  Filled 2020-03-10: qty 10

## 2020-03-10 NOTE — Patient Instructions (Signed)
Sodium Ferric Gluconate Complex injection What is this medicine? SODIUM FERRIC GLUCONATE COMPLEX (SOE dee um FER ik GLOO koe nate KOM pleks) is an iron replacement. It is used with epoetin therapy to treat low iron levels in patients who are receiving hemodialysis. This medicine may be used for other purposes; ask your health care provider or pharmacist if you have questions. COMMON BRAND NAME(S): Ferrlecit, Nulecit What should I tell my health care provider before I take this medicine? They need to know if you have any of the following conditions:  anemia that is not from iron deficiency  high levels of iron in the body  an unusual or allergic reaction to iron, benzyl alcohol, other medicines, foods, dyes, or preservatives  pregnant or are trying to become pregnant  breast-feeding How should I use this medicine? This medicine is for infusion into a vein. It is given by a health care professional in a hospital or clinic setting. Talk to your pediatrician regarding the use of this medicine in children. While this drug may be prescribed for children as young as 6 years old for selected conditions, precautions do apply. Overdosage: If you think you have taken too much of this medicine contact a poison control center or emergency room at once. NOTE: This medicine is only for you. Do not share this medicine with others. What if I miss a dose? It is important not to miss your dose. Call your doctor or health care professional if you are unable to keep an appointment. What may interact with this medicine? Do not take this medicine with any of the following medications:  deferoxamine  dimercaprol  other iron products This medicine may also interact with the following medications:  chloramphenicol  deferasirox  medicine for blood pressure like enalapril This list may not describe all possible interactions. Give your health care provider a list of all the medicines, herbs,  non-prescription drugs, or dietary supplements you use. Also tell them if you smoke, drink alcohol, or use illegal drugs. Some items may interact with your medicine. What should I watch for while using this medicine? Your condition will be monitored carefully while you are receiving this medicine. Visit your doctor for check-ups as directed. What side effects may I notice from receiving this medicine? Side effects that you should report to your doctor or health care professional as soon as possible:  allergic reactions like skin rash, itching or hives, swelling of the face, lips, or tongue  breathing problems  changes in hearing  changes in vision  chills, flushing, or sweating  fast, irregular heartbeat  feeling faint or lightheaded, falls  fever, flu-like symptoms  high or low blood pressure  pain, tingling, numbness in the hands or feet  severe pain in the chest, back, flanks, or groin  swelling of the ankles, feet, hands  trouble passing urine or change in the amount of urine  unusually weak or tired Side effects that usually do not require medical attention (report to your doctor or health care professional if they continue or are bothersome):  cramps  dark colored stools  diarrhea  headache  nausea, vomiting  stomach upset This list may not describe all possible side effects. Call your doctor for medical advice about side effects. You may report side effects to FDA at 1-800-FDA-1088. Where should I keep my medicine? This drug is given in a hospital or clinic and will not be stored at home. NOTE: This sheet is a summary. It may not cover all   possible information. If you have questions about this medicine, talk to your doctor, pharmacist, or health care provider.  2020 Elsevier/Gold Standard (2007-12-16 15:58:57)  

## 2020-03-10 NOTE — Progress Notes (Signed)
Patient declined to stay for 30 min post observation period. VSS upon leaving infusion room.

## 2020-04-20 ENCOUNTER — Other Ambulatory Visit: Payer: PRIVATE HEALTH INSURANCE

## 2020-06-12 IMAGING — US US PELVIS COMPLETE WITH TRANSVAGINAL
1 series · 13 of 25 positions shown · non-contrast
Comparison: None

CLINICAL DATA: Pelvic pain, history endometriosis, LMP 06/02/2019
with menses on going since that time

EXAM:
TRANSABDOMINAL AND TRANSVAGINAL ULTRASOUND OF PELVIS
TECHNIQUE: Both transabdominal and transvaginal ultrasound examinations of the
pelvis were performed. Transabdominal technique was performed for
global imaging of the pelvis including uterus, ovaries, adnexal
regions, and pelvic cul-de-sac. It was necessary to proceed with
endovaginal exam following the transabdominal exam to visualize the
endometrium and LEFT ovary.

[Series 1: us pelvis complete with transvaginal · 0.23mm/px · 13 of 106 slices shown]
[im 1/106]
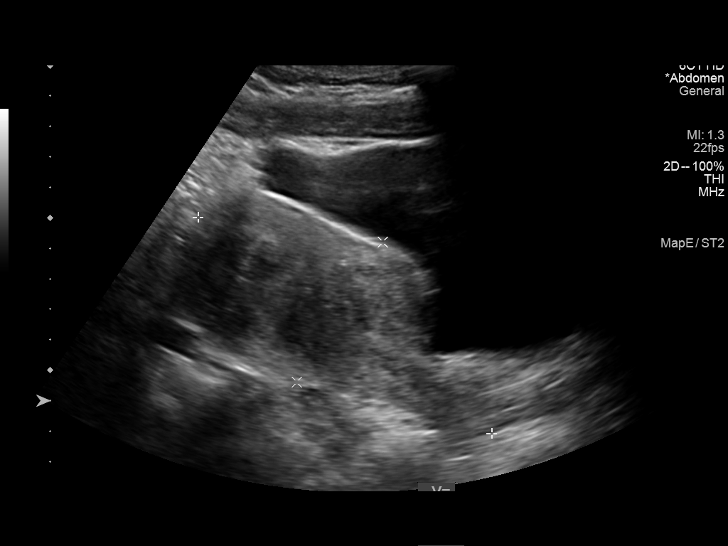
[im 9/106]
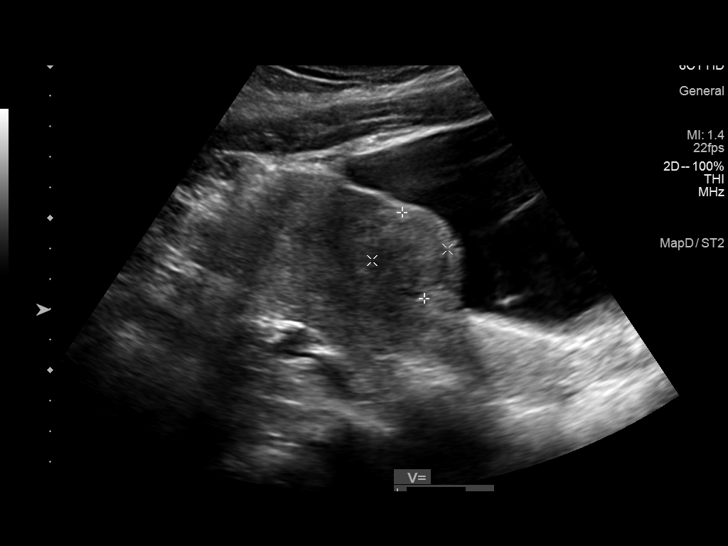
[im 18/106]
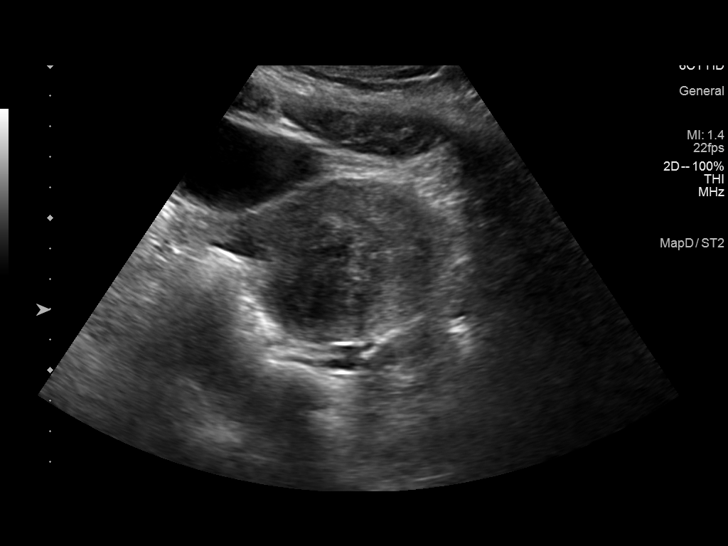
[im 27/106]
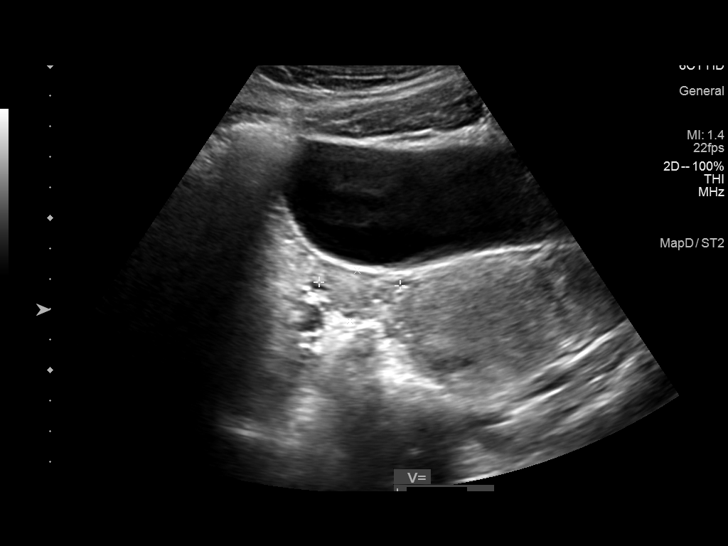
[im 36/106]
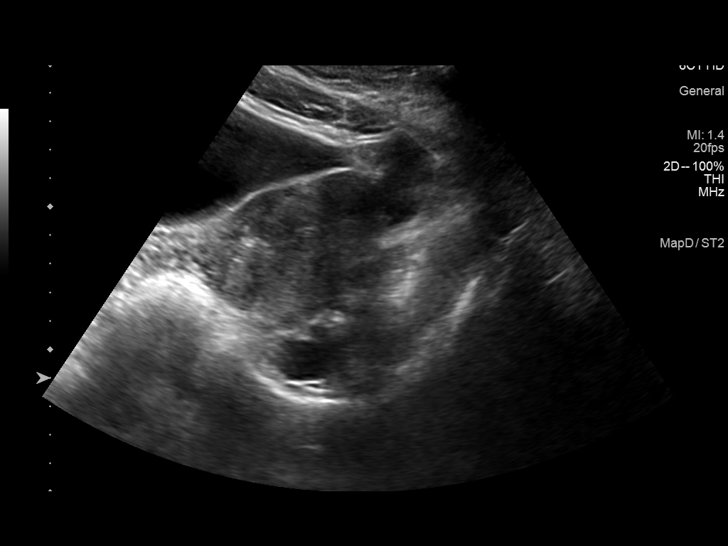
[im 44/106]
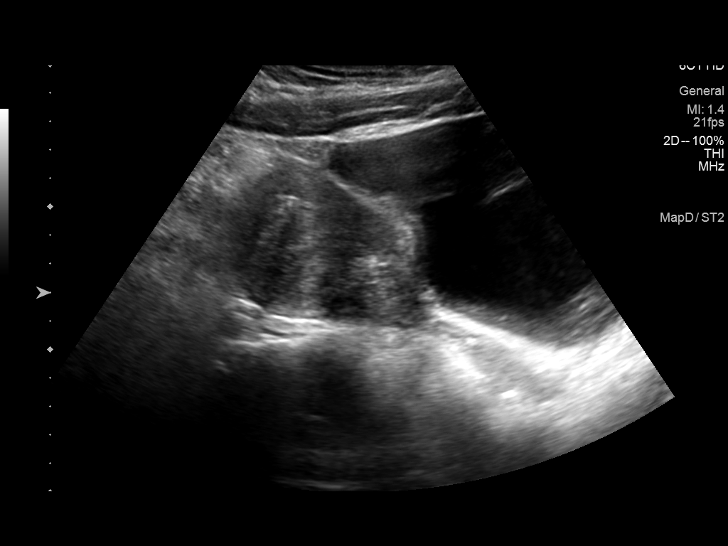
[im 53/106]
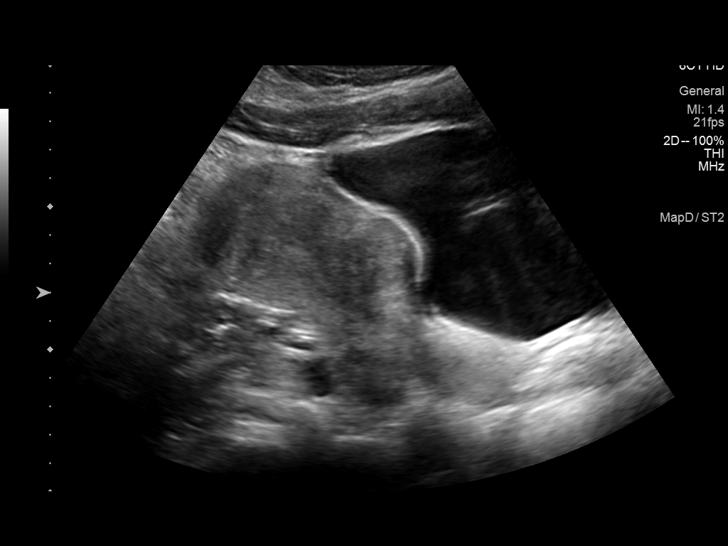
[im 62/106]
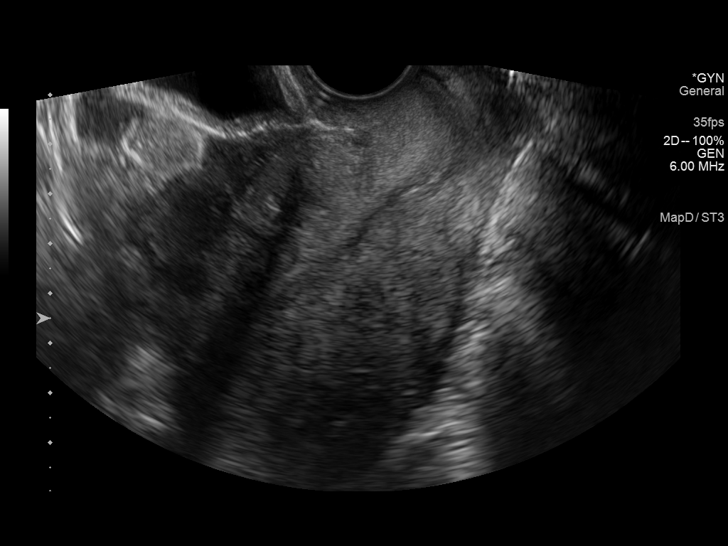
[im 71/106]
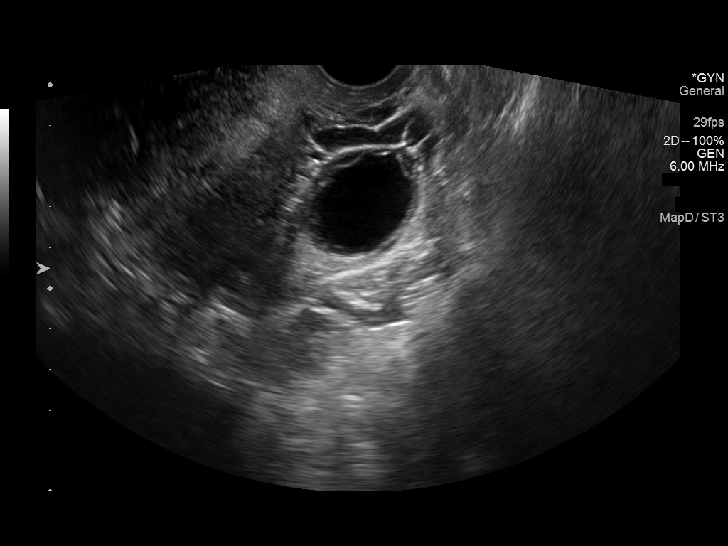
[im 79/106]
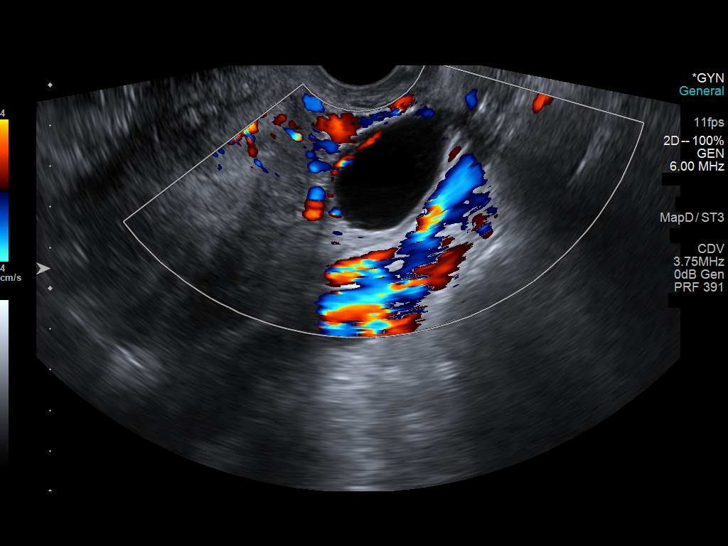
[im 88/106]
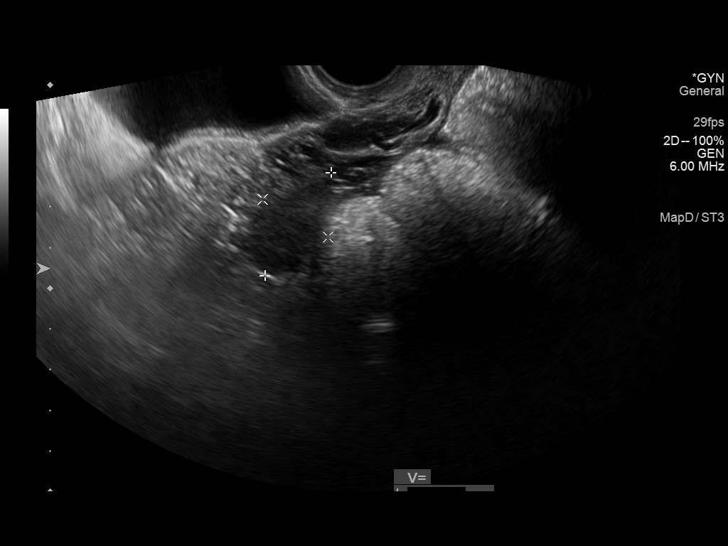
[im 97/106]
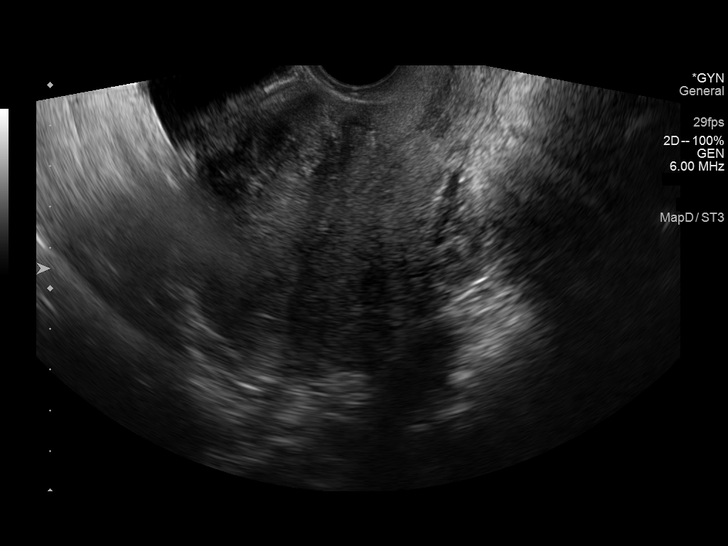
[im 106/106]
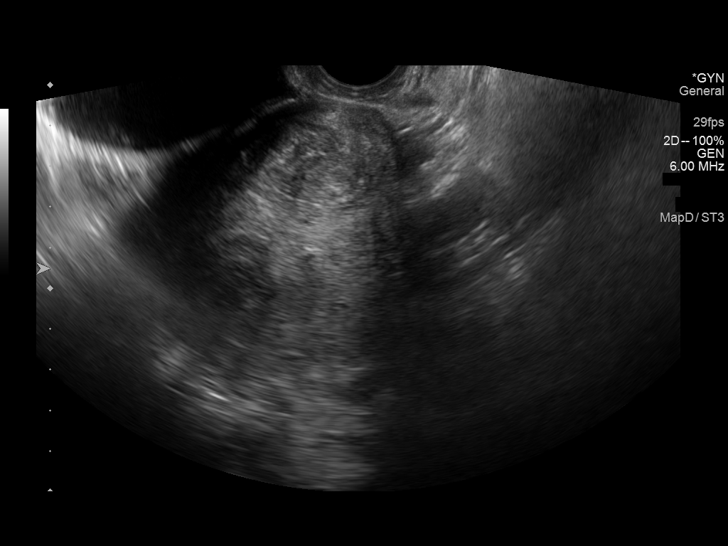

[13 of 25 positions shown; findings below may reference images not displayed]

FINDINGS: Uterus

Measurements: 12.0 x 5.4 x 6.9 cm = volume: 234 mL. Anteverted.
Heterogeneous myometrium. Several uterine masses are seen consistent
with leiomyomata. These include a fundal leiomyoma 4.1 x 3.1 x
cm on RIGHT, submucosal leiomyoma at anterior mid uterus slightly to
LEFT 2.9 x 2.5 x 2.1 cm, and a subserosal/exophytic leiomyoma
anterior upper uterus 2.2 x 2.0 x 2.1 cm.

Endometrium

Thickness: 11 mm.  No endometrial fluid

Right ovary

Measurements: 2.7 x 1.7 x 3.3 cm = volume: 7.6 mL. Normal morphology
without mass

Left ovary

Measurements: 4.1 x 1.9 x 2.9 cm = volume: 11.6 mL. Cyst within LEFT
ovary with a slightly irregular wall, overall lesion measuring 2.9 x
2.8 x 3.5 cm. No septations or mural nodules.

Other findings

No free pelvic fluid.  No adnexal masses.
IMPRESSION: Multiple uterine leiomyomata as above, 1 of which appears
submucosal.

Unremarkable endometrial complex 11 mm thick.

Minimally complicated cyst of the LEFT ovary 3.5 cm cyst diameter,
demonstrating minimal irregularity of a portion of the wall, but
without definite mural nodule or septations identified.

## 2020-08-07 IMAGING — US US PELVIS COMPLETE WITH TRANSVAGINAL
1 series · 13 of 25 positions shown · non-contrast
Comparison: 07/13/2019

CLINICAL DATA: Follow-up left ovarian cyst



[Series 1: us pelvis complete with transvaginal · 0.21mm/px · 13 of 114 slices shown]
[im 1/114]
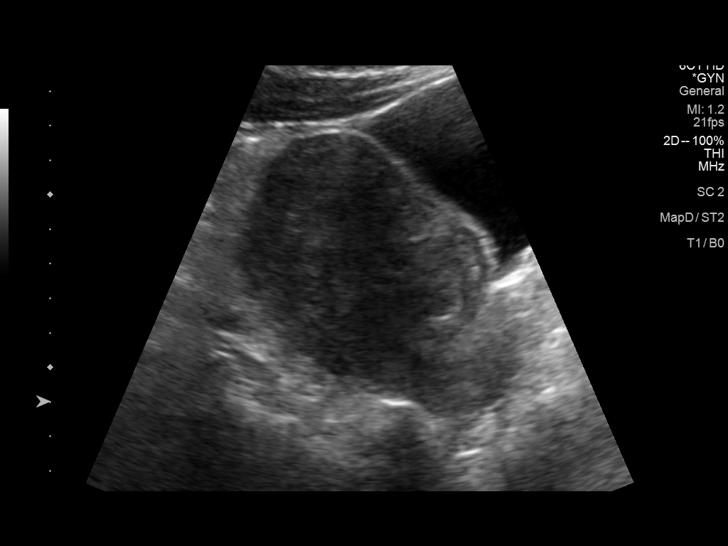
[im 10/114]
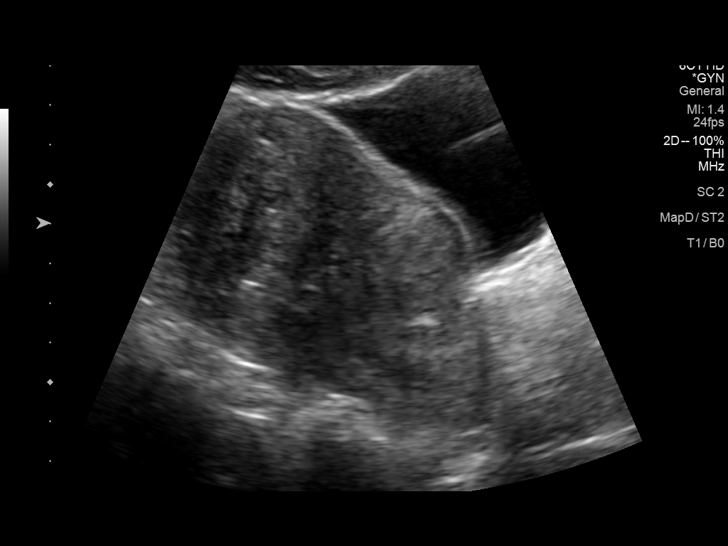
[im 19/114]
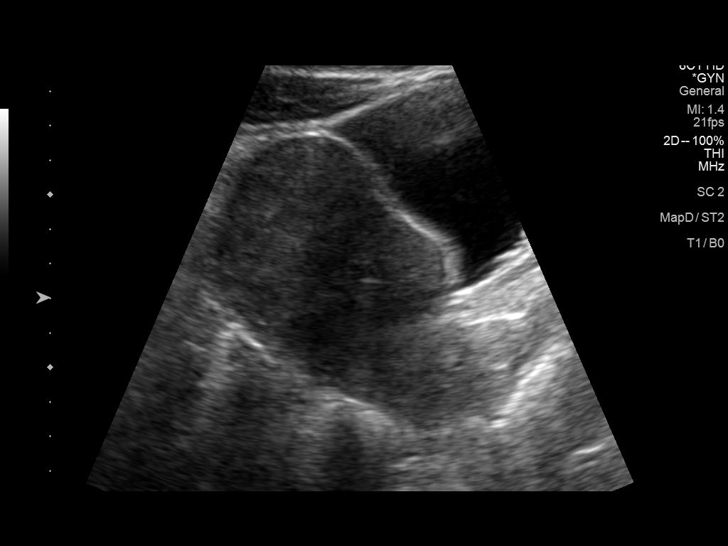
[im 29/114]
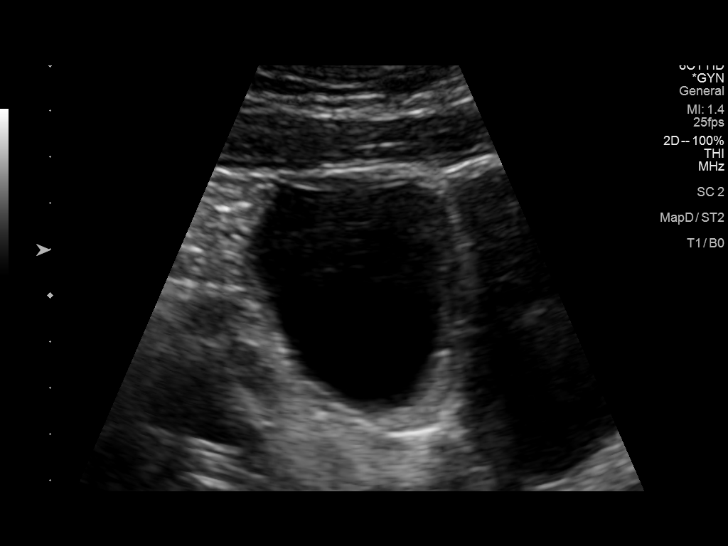
[im 38/114]
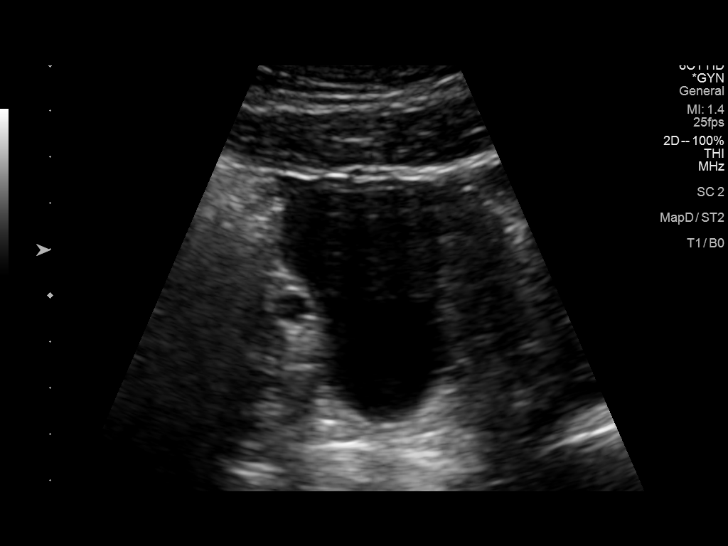
[im 48/114]
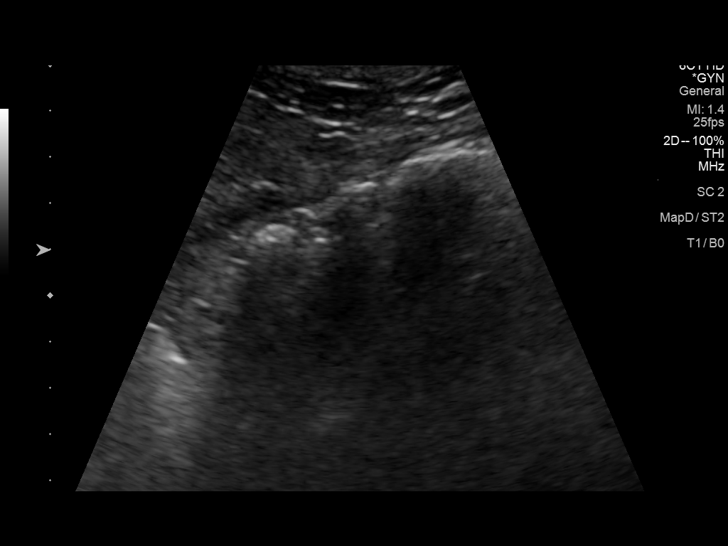
[im 57/114]
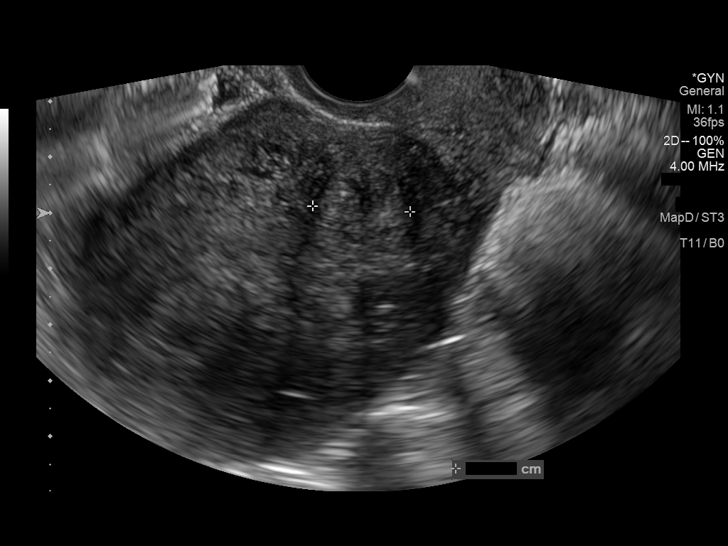
[im 66/114]
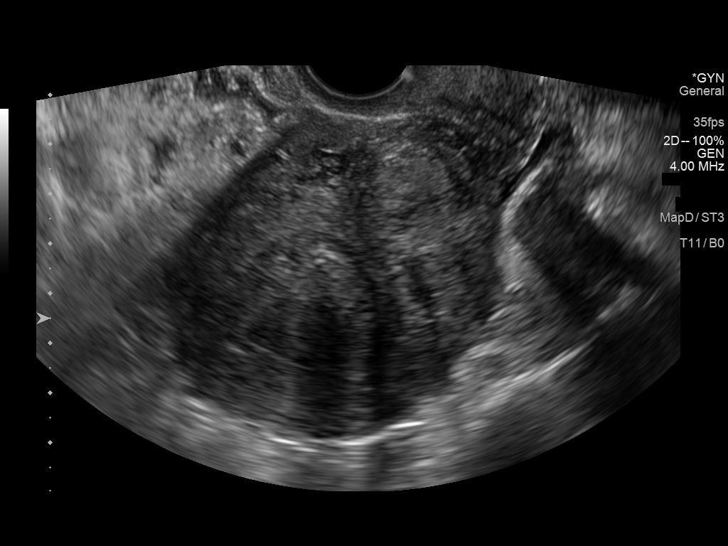
[im 76/114]
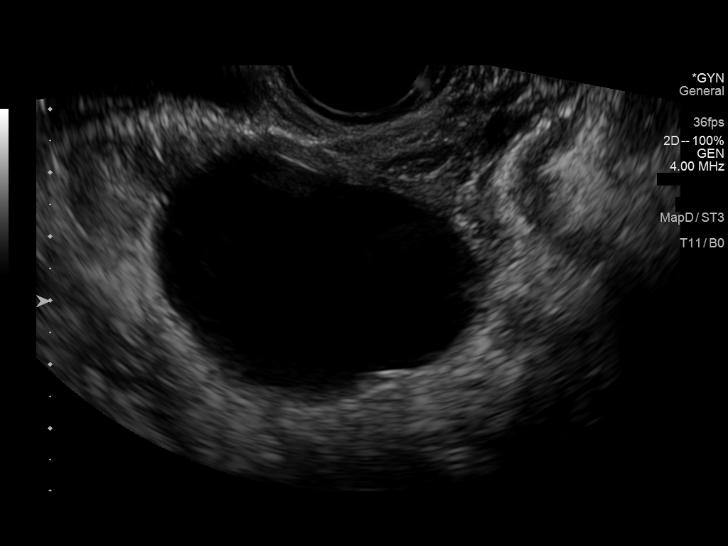
[im 85/114]
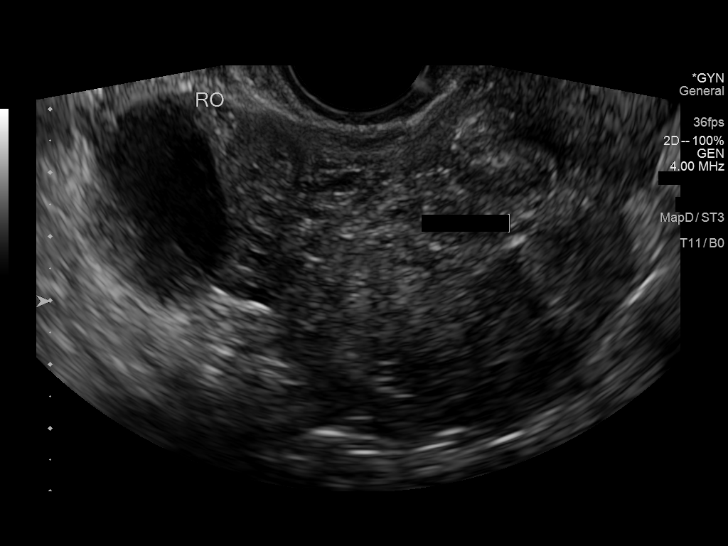
[im 95/114]
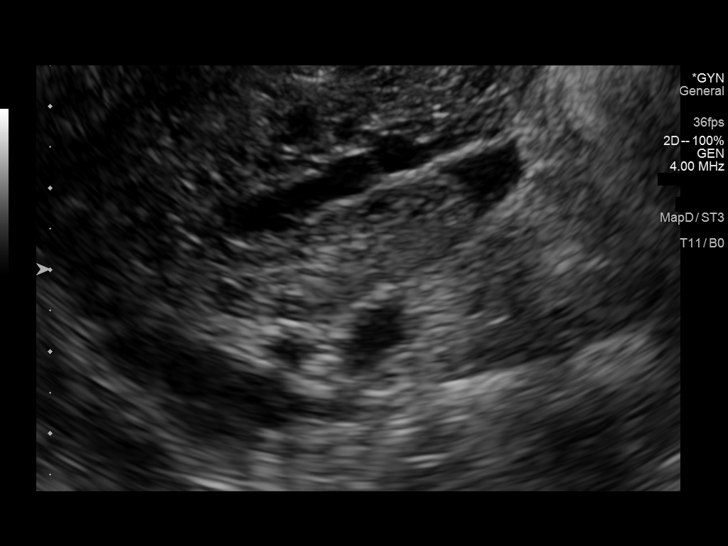
[im 104/114]
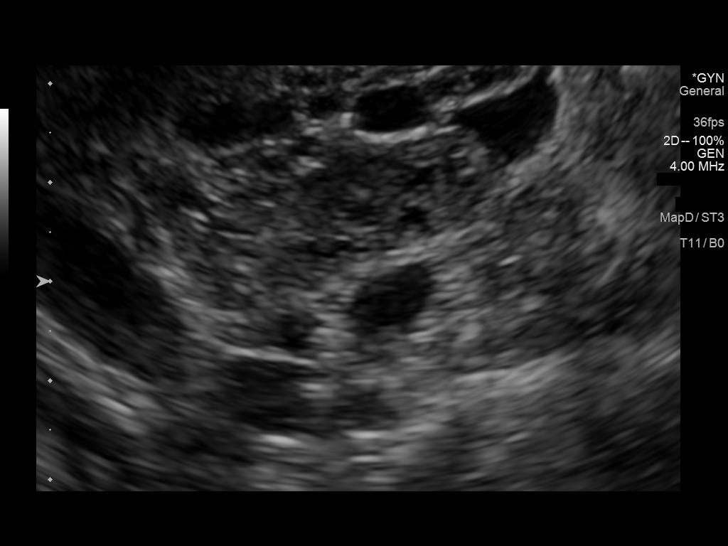
[im 114/114]
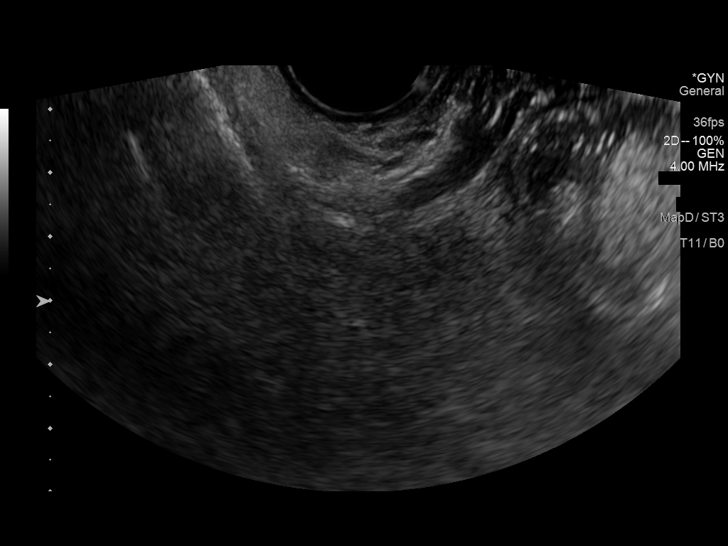

[13 of 25 positions shown; findings below may reference images not displayed]

FINDINGS: Uterus

Measurements: 10.3 x 5.6 x 6.0 cm = volume: 173 mL. Several small
fibroids, the largest 2.9 cm in the anterior and posterior fundus.

Endometrium

Thickness: 8 mm in thickness.  No focal abnormality visualized.

Right ovary

Measurements: 4.2 x 3.0 x 3.6 cm = volume: 23 mL. 5 cm cyst. No
internal blood flow ir septations.

Left ovary

Measurements: 4.2 x 1.1 x 1.4 cm = volume: 3.5 mL. Normal
appearance/no adnexal mass.

Other findings

Trace free fluid.
IMPRESSION: 5 cm simple appearing right ovarian cyst. This is new since prior
study and likely functional cyst. This has benign characteristics.
No imaging follow up is required for premenopausal females. This
follows consensus guidelines: Simple Adnexal Cysts: SRU Consensus
Conference Update on Follow-up and Reporting. Radiology 8263;
[DATE].

Resolution of previously seen left ovarian cyst.

Fibroid uterus.

## 2020-08-17 ENCOUNTER — Ambulatory Visit: Payer: PRIVATE HEALTH INSURANCE | Admitting: Allergy

## 2020-10-16 ENCOUNTER — Other Ambulatory Visit (HOSPITAL_COMMUNITY): Payer: Self-pay | Admitting: Obstetrics & Gynecology

## 2020-10-16 DIAGNOSIS — D219 Benign neoplasm of connective and other soft tissue, unspecified: Secondary | ICD-10-CM

## 2020-11-17 ENCOUNTER — Ambulatory Visit: Payer: PRIVATE HEALTH INSURANCE | Admitting: Allergy

## 2021-02-14 ENCOUNTER — Encounter: Payer: Self-pay | Admitting: Internal Medicine

## 2021-02-14 ENCOUNTER — Other Ambulatory Visit: Payer: Self-pay | Admitting: Obstetrics & Gynecology

## 2021-02-14 DIAGNOSIS — Z363 Encounter for antenatal screening for malformations: Secondary | ICD-10-CM

## 2021-02-15 ENCOUNTER — Ambulatory Visit: Payer: Managed Care, Other (non HMO) | Attending: Obstetrics & Gynecology

## 2021-02-15 ENCOUNTER — Ambulatory Visit: Payer: Managed Care, Other (non HMO) | Admitting: *Deleted

## 2021-02-15 ENCOUNTER — Other Ambulatory Visit: Payer: Self-pay

## 2021-02-15 ENCOUNTER — Encounter: Payer: Self-pay | Admitting: *Deleted

## 2021-02-15 ENCOUNTER — Other Ambulatory Visit: Payer: Self-pay | Admitting: Obstetrics & Gynecology

## 2021-02-15 VITALS — BP 116/70 | HR 68

## 2021-02-15 DIAGNOSIS — Z363 Encounter for antenatal screening for malformations: Secondary | ICD-10-CM | POA: Diagnosis not present

## 2021-02-15 DIAGNOSIS — O26899 Other specified pregnancy related conditions, unspecified trimester: Secondary | ICD-10-CM

## 2021-02-15 DIAGNOSIS — R1084 Generalized abdominal pain: Secondary | ICD-10-CM | POA: Insufficient documentation

## 2021-02-22 ENCOUNTER — Ambulatory Visit: Payer: PRIVATE HEALTH INSURANCE | Admitting: Allergy

## 2021-02-22 DIAGNOSIS — J309 Allergic rhinitis, unspecified: Secondary | ICD-10-CM

## 2021-02-23 LAB — OB RESULTS CONSOLE HEPATITIS B SURFACE ANTIGEN: Hepatitis B Surface Ag: NEGATIVE

## 2021-02-23 LAB — OB RESULTS CONSOLE HIV ANTIBODY (ROUTINE TESTING): HIV: NONREACTIVE

## 2021-02-23 LAB — OB RESULTS CONSOLE RUBELLA ANTIBODY, IGM: Rubella: IMMUNE

## 2021-07-13 ENCOUNTER — Other Ambulatory Visit: Payer: Self-pay | Admitting: Obstetrics & Gynecology

## 2021-08-02 LAB — OB RESULTS CONSOLE GBS: GBS: POSITIVE

## 2021-08-06 ENCOUNTER — Encounter (HOSPITAL_COMMUNITY): Payer: Self-pay

## 2021-08-06 NOTE — Patient Instructions (Signed)
Lori Carson ? 08/06/2021 ? ? Your procedure is scheduled on:  08/20/2021 ? Arrive at [facility] on [address] at [facility]  and [address]. You are invited to use the FREE valet parking or use the Visitor's parking deck. ? Pick up the phone at the desk and dial [phone]. ? Call this number if you have problems the morning of surgery: [phone] ? Remember: ? ? Do not eat food:(After Midnight) Desp?s de medianoche. ? Do not drink clear liquids: (After Midnight) Desp?s de medianoche. ? Take these medicines the morning of surgery with A SIP OF WATER:  none ? ? Do not wear jewelry, make-up or nail polish. ? Do not wear lotions, powders, or perfumes. Do not wear deodorant. ? Do not shave 48 hours prior to surgery. ? Do not bring valuables to the hospital.  [facility] is not  ? responsible for any belongings or valuables brought to the hospital. ? Contacts, dentures or bridgework may not be worn into surgery. ? Leave suitcase in the car. After surgery it may be brought to your room. ? For patients admitted to the hospital, checkout time is 11:00 AM the day of  ?            discharge. ? ?   ? Please read over the following fact sheets that you were given:  ?   Preparing for Surgery ? ? ?

## 2021-08-16 ENCOUNTER — Inpatient Hospital Stay (HOSPITAL_COMMUNITY): Admission: RE | Admit: 2021-08-16 | Payer: Managed Care, Other (non HMO) | Source: Ambulatory Visit

## 2021-08-17 ENCOUNTER — Other Ambulatory Visit (HOSPITAL_COMMUNITY)
Admission: RE | Admit: 2021-08-17 | Discharge: 2021-08-17 | Disposition: A | Discharge status: Home / Self Care | Payer: Managed Care, Other (non HMO) | Source: Ambulatory Visit | Attending: Obstetrics & Gynecology | Admitting: Obstetrics & Gynecology

## 2021-08-17 DIAGNOSIS — Z01812 Encounter for preprocedural laboratory examination: Secondary | ICD-10-CM | POA: Insufficient documentation

## 2021-08-17 LAB — CBC
HCT: 40.2 % (ref 36.0–46.0)
Hemoglobin: 12.8 g/dL (ref 12.0–15.0)
MCH: 26.6 pg (ref 26.0–34.0)
MCHC: 31.8 g/dL (ref 30.0–36.0)
MCV: 83.6 fL (ref 80.0–100.0)
Platelets: 247 10*3/uL (ref 150–400)
RBC: 4.81 MIL/uL (ref 3.87–5.11)
RDW: 15.4 % (ref 11.5–15.5)
WBC: 6.3 10*3/uL (ref 4.0–10.5)
nRBC: 0 % (ref 0.0–0.2)

## 2021-08-17 LAB — TYPE AND SCREEN
ABO/RH(D): B POS
Antibody Screen: NEGATIVE

## 2021-08-18 ENCOUNTER — Encounter (HOSPITAL_COMMUNITY): Payer: Self-pay | Admitting: Obstetrics & Gynecology

## 2021-08-18 LAB — RPR: RPR Ser Ql: NONREACTIVE

## 2021-08-18 NOTE — Anesthesia Preprocedure Evaluation (Addendum)
Anesthesia Evaluation  ?Patient identified by MRN, date of birth, ID band ?Patient awake ? ? ? ?Reviewed: ?Allergy & Precautions, NPO status , Patient's Chart, lab work & pertinent test results ? ?Airway ?Mallampati: II ? ?TM Distance: >3 FB ?Neck ROM: Full ? ? ? Dental ?no notable dental hx. ?(+) Teeth Intact, Dental Advisory Given ?  ?Pulmonary ?asthma ,  ?  ?Pulmonary exam normal ?breath sounds clear to auscultation ? ? ? ? ? ? Cardiovascular ?Normal cardiovascular exam ?Rhythm:Regular Rate:Normal ? ? ?  ?Neuro/Psych ?negative neurological ROS ? negative psych ROS  ? GI/Hepatic ?Neg liver ROS, hiatal hernia, GERD  Medicated and Controlled,  ?Endo/Other  ?Hyperprolactinemia ? ? Renal/GU ?negative Renal ROS  ?negative genitourinary ?  ?Musculoskeletal ?negative musculoskeletal ROS ?(+)  ? Abdominal ?(+) + obese,   ?Peds ? Hematology ? ?(+) Blood dyscrasia, anemia ,   ?Anesthesia Other Findings ? ? Reproductive/Obstetrics ?(+) Pregnancy ?Previous myomectomy x 2 ? ?  ? ? ? ? ? ? ? ? ? ? ? ? ? ?  ?  ? ? ? ? ? ? ?Anesthesia Physical ?Anesthesia Plan ? ?ASA: 2 ? ?Anesthesia Plan: Spinal  ? ?Post-op Pain Management:   ? ?Induction:  ? ?PONV Risk Score and Plan: 4 or greater and Treatment may vary due to age or medical condition and Scopolamine patch - Pre-op ? ?Airway Management Planned: Natural Airway ? ?Additional Equipment: None ? ?Intra-op Plan:  ? ?Post-operative Plan:  ? ?Informed Consent: I have reviewed the patients History and Physical, chart, labs and discussed the procedure including the risks, benefits and alternatives for the proposed anesthesia with the patient or authorized representative who has indicated his/her understanding and acceptance.  ? ? ? ?Dental advisory given ? ?Plan Discussed with: CRNA and Anesthesiologist ? ?Anesthesia Plan Comments:   ? ? ? ? ? ?Anesthesia Quick Evaluation ? ?

## 2021-08-20 ENCOUNTER — Encounter (HOSPITAL_COMMUNITY): Payer: Self-pay | Admitting: Obstetrics & Gynecology

## 2021-08-20 ENCOUNTER — Inpatient Hospital Stay (HOSPITAL_COMMUNITY)
Admission: RE | Admit: 2021-08-20 | Discharge: 2021-08-23 | DRG: 788 | Disposition: A | Discharge status: Home / Self Care | Payer: Managed Care, Other (non HMO) | Attending: Obstetrics & Gynecology | Admitting: Obstetrics & Gynecology

## 2021-08-20 ENCOUNTER — Inpatient Hospital Stay (HOSPITAL_COMMUNITY): Payer: Managed Care, Other (non HMO) | Admitting: Anesthesiology

## 2021-08-20 ENCOUNTER — Other Ambulatory Visit: Payer: Self-pay

## 2021-08-20 ENCOUNTER — Encounter (HOSPITAL_COMMUNITY)
Admission: RE | Disposition: A | Discharge status: Home / Self Care | Payer: Self-pay | Source: Home / Self Care | Attending: Obstetrics & Gynecology

## 2021-08-20 ENCOUNTER — Encounter: Payer: Self-pay | Admitting: Internal Medicine

## 2021-08-20 DIAGNOSIS — D649 Anemia, unspecified: Secondary | ICD-10-CM | POA: Diagnosis not present

## 2021-08-20 DIAGNOSIS — O99824 Streptococcus B carrier state complicating childbirth: Secondary | ICD-10-CM | POA: Diagnosis present

## 2021-08-20 DIAGNOSIS — O3429 Maternal care due to uterine scar from other previous surgery: Principal | ICD-10-CM | POA: Diagnosis present

## 2021-08-20 DIAGNOSIS — Z3A37 37 weeks gestation of pregnancy: Secondary | ICD-10-CM | POA: Diagnosis not present

## 2021-08-20 DIAGNOSIS — Z3A00 Weeks of gestation of pregnancy not specified: Secondary | ICD-10-CM | POA: Diagnosis not present

## 2021-08-20 DIAGNOSIS — Z9889 Other specified postprocedural states: Secondary | ICD-10-CM

## 2021-08-20 DIAGNOSIS — D509 Iron deficiency anemia, unspecified: Secondary | ICD-10-CM | POA: Diagnosis present

## 2021-08-20 DIAGNOSIS — O9902 Anemia complicating childbirth: Secondary | ICD-10-CM

## 2021-08-20 DIAGNOSIS — Z98891 History of uterine scar from previous surgery: Secondary | ICD-10-CM

## 2021-08-20 SURGERY — Surgical Case
Anesthesia: Spinal | Wound class: Clean Contaminated

## 2021-08-20 MED ORDER — SIMETHICONE 80 MG PO CHEW
80.0000 mg | CHEWABLE_TABLET | Freq: Three times a day (TID) | ORAL | Status: DC
  Administered 2021-08-21 – 2021-08-23 (×6): 80 mg via ORAL
  Filled 2021-08-20 (×6): qty 1

## 2021-08-20 MED ORDER — SOD CITRATE-CITRIC ACID 500-334 MG/5ML PO SOLN
ORAL | Status: AC
  Filled 2021-08-20: qty 30

## 2021-08-20 MED ORDER — FENTANYL CITRATE (PF) 100 MCG/2ML IJ SOLN: 25.0000 ug | INTRAMUSCULAR | Status: DC | PRN

## 2021-08-20 MED ORDER — MORPHINE SULFATE (PF) 0.5 MG/ML IJ SOLN
INTRAMUSCULAR | Status: DC | PRN
  Administered 2021-08-20: 150 ug via INTRATHECAL

## 2021-08-20 MED ORDER — MEPERIDINE HCL 25 MG/ML IJ SOLN: 6.2500 mg | INTRAMUSCULAR | Status: DC | PRN

## 2021-08-20 MED ORDER — MORPHINE SULFATE (PF) 0.5 MG/ML IJ SOLN
INTRAMUSCULAR | Status: AC
Start: 2021-08-20 — End: ?
  Filled 2021-08-20: qty 10

## 2021-08-20 MED ORDER — CEFAZOLIN SODIUM-DEXTROSE 2-4 GM/100ML-% IV SOLN
INTRAVENOUS | Status: AC
  Filled 2021-08-20: qty 100

## 2021-08-20 MED ORDER — SOD CITRATE-CITRIC ACID 500-334 MG/5ML PO SOLN
30.0000 mL | Freq: Once | ORAL | Status: AC
  Administered 2021-08-20: 30 mL via ORAL

## 2021-08-20 MED ORDER — SCOPOLAMINE 1 MG/3DAYS TD PT72
MEDICATED_PATCH | TRANSDERMAL | Status: AC
  Filled 2021-08-20: qty 1

## 2021-08-20 MED ORDER — DIBUCAINE (PERIANAL) 1 % EX OINT: 1.0000 "application " | TOPICAL_OINTMENT | CUTANEOUS | Status: DC | PRN

## 2021-08-20 MED ORDER — OXYTOCIN-SODIUM CHLORIDE 30-0.9 UT/500ML-% IV SOLN
INTRAVENOUS | Status: AC
  Filled 2021-08-20: qty 500

## 2021-08-20 MED ORDER — FENTANYL CITRATE (PF) 100 MCG/2ML IJ SOLN
INTRAMUSCULAR | Status: AC
Start: 2021-08-20 — End: ?
  Filled 2021-08-20: qty 2

## 2021-08-20 MED ORDER — KETOROLAC TROMETHAMINE 30 MG/ML IJ SOLN
30.0000 mg | Freq: Four times a day (QID) | INTRAMUSCULAR | Status: DC | PRN
  Administered 2021-08-20: 30 mg via INTRAVENOUS

## 2021-08-20 MED ORDER — PRENATAL MULTIVITAMIN CH
1.0000 | ORAL_TABLET | Freq: Every day | ORAL | Status: DC
  Administered 2021-08-22 – 2021-08-23 (×2): 1 via ORAL
  Filled 2021-08-20 (×2): qty 1

## 2021-08-20 MED ORDER — PANTOPRAZOLE SODIUM 40 MG PO TBEC
40.0000 mg | DELAYED_RELEASE_TABLET | Freq: Every day | ORAL | Status: DC | PRN
  Filled 2021-08-20: qty 1

## 2021-08-20 MED ORDER — OXYTOCIN-SODIUM CHLORIDE 30-0.9 UT/500ML-% IV SOLN
INTRAVENOUS | Status: DC | PRN
  Administered 2021-08-20: 400 mL via INTRAVENOUS

## 2021-08-20 MED ORDER — POVIDONE-IODINE 10 % EX SWAB
2.0000 "application " | Freq: Once | CUTANEOUS | Status: AC
  Administered 2021-08-20: 2 via TOPICAL

## 2021-08-20 MED ORDER — PHENYLEPHRINE 80 MCG/ML (10ML) SYRINGE FOR IV PUSH (FOR BLOOD PRESSURE SUPPORT)
PREFILLED_SYRINGE | INTRAVENOUS | Status: DC | PRN
Start: 2021-08-20 — End: 2021-08-20
  Administered 2021-08-20: 80 ug via INTRAVENOUS

## 2021-08-20 MED ORDER — IBUPROFEN 600 MG PO TABS
600.0000 mg | ORAL_TABLET | Freq: Four times a day (QID) | ORAL | Status: DC
  Administered 2021-08-22 – 2021-08-23 (×7): 600 mg via ORAL
  Filled 2021-08-20 (×8): qty 1

## 2021-08-20 MED ORDER — MENTHOL 3 MG MT LOZG: 1.0000 | LOZENGE | OROMUCOSAL | Status: DC | PRN

## 2021-08-20 MED ORDER — LACTATED RINGERS IV SOLN: INTRAVENOUS | Status: DC

## 2021-08-20 MED ORDER — SENNOSIDES-DOCUSATE SODIUM 8.6-50 MG PO TABS
2.0000 | ORAL_TABLET | Freq: Every day | ORAL | Status: DC
  Administered 2021-08-22 – 2021-08-23 (×2): 2 via ORAL
  Filled 2021-08-20 (×2): qty 2

## 2021-08-20 MED ORDER — DIPHENHYDRAMINE HCL 50 MG/ML IJ SOLN: 12.5000 mg | INTRAMUSCULAR | Status: DC | PRN

## 2021-08-20 MED ORDER — TETANUS-DIPHTH-ACELL PERTUSSIS 5-2.5-18.5 LF-MCG/0.5 IM SUSY: 0.5000 mL | PREFILLED_SYRINGE | Freq: Once | INTRAMUSCULAR | Status: DC

## 2021-08-20 MED ORDER — MORPHINE SULFATE (PF) 0.5 MG/ML IJ SOLN: INTRAMUSCULAR | Status: DC | PRN

## 2021-08-20 MED ORDER — ONDANSETRON HCL 4 MG/2ML IJ SOLN: 4.0000 mg | Freq: Three times a day (TID) | INTRAMUSCULAR | Status: DC | PRN

## 2021-08-20 MED ORDER — PHENYLEPHRINE HCL-NACL 20-0.9 MG/250ML-% IV SOLN
INTRAVENOUS | Status: DC | PRN
  Administered 2021-08-20: 60 ug/min via INTRAVENOUS

## 2021-08-20 MED ORDER — NALOXONE HCL 4 MG/10ML IJ SOLN
1.0000 ug/kg/h | INTRAVENOUS | Status: DC | PRN
  Filled 2021-08-20: qty 5

## 2021-08-20 MED ORDER — COCONUT OIL OIL: 1.0000 "application " | TOPICAL_OIL | Status: DC | PRN

## 2021-08-20 MED ORDER — FENTANYL CITRATE (PF) 100 MCG/2ML IJ SOLN
INTRAMUSCULAR | Status: DC | PRN
  Administered 2021-08-20: 15 ug via INTRATHECAL

## 2021-08-20 MED ORDER — WITCH HAZEL-GLYCERIN EX PADS: 1.0000 "application " | MEDICATED_PAD | CUTANEOUS | Status: DC | PRN

## 2021-08-20 MED ORDER — ONDANSETRON HCL 4 MG/2ML IJ SOLN: 4.0000 mg | Freq: Once | INTRAMUSCULAR | Status: DC | PRN

## 2021-08-20 MED ORDER — DEXAMETHASONE SODIUM PHOSPHATE 10 MG/ML IJ SOLN
INTRAMUSCULAR | Status: AC
Start: 2021-08-20 — End: ?
  Filled 2021-08-20: qty 1

## 2021-08-20 MED ORDER — FERROUS SULFATE 325 (65 FE) MG PO TABS
325.0000 mg | ORAL_TABLET | ORAL | Status: DC
  Administered 2021-08-21 – 2021-08-22 (×2): 325 mg via ORAL
  Filled 2021-08-20 (×2): qty 1

## 2021-08-20 MED ORDER — HYDROMORPHONE HCL 2 MG PO TABS
2.0000 mg | ORAL_TABLET | Freq: Four times a day (QID) | ORAL | Status: DC | PRN
  Administered 2021-08-22 (×2): 2 mg via ORAL
  Filled 2021-08-20 (×2): qty 1

## 2021-08-20 MED ORDER — KETOROLAC TROMETHAMINE 30 MG/ML IJ SOLN
INTRAMUSCULAR | Status: AC
  Filled 2021-08-20: qty 1

## 2021-08-20 MED ORDER — DEXAMETHASONE SODIUM PHOSPHATE 10 MG/ML IJ SOLN
INTRAMUSCULAR | Status: DC | PRN
  Administered 2021-08-20: 10 mg via INTRAVENOUS

## 2021-08-20 MED ORDER — NALOXONE HCL 0.4 MG/ML IJ SOLN: 0.4000 mg | INTRAMUSCULAR | Status: DC | PRN

## 2021-08-20 MED ORDER — KETOROLAC TROMETHAMINE 30 MG/ML IJ SOLN
30.0000 mg | Freq: Four times a day (QID) | INTRAMUSCULAR | Status: AC
  Administered 2021-08-21 (×3): 30 mg via INTRAVENOUS
  Filled 2021-08-20 (×3): qty 1

## 2021-08-20 MED ORDER — CEFAZOLIN SODIUM-DEXTROSE 2-4 GM/100ML-% IV SOLN
2.0000 g | INTRAVENOUS | Status: AC
  Administered 2021-08-20: 2 g via INTRAVENOUS

## 2021-08-20 MED ORDER — SODIUM CHLORIDE 0.9 % IV SOLN
6.2500 mg | Freq: Once | INTRAVENOUS | Status: AC
  Administered 2021-08-20: 6.25 mg via INTRAVENOUS
  Filled 2021-08-20: qty 0.25

## 2021-08-20 MED ORDER — ONDANSETRON HCL 4 MG/2ML IJ SOLN
INTRAMUSCULAR | Status: DC | PRN
  Administered 2021-08-20: 4 mg via INTRAVENOUS

## 2021-08-20 MED ORDER — SODIUM CHLORIDE 0.9% FLUSH: 3.0000 mL | INTRAVENOUS | Status: DC | PRN

## 2021-08-20 MED ORDER — DIPHENHYDRAMINE HCL 25 MG PO CAPS: 25.0000 mg | ORAL_CAPSULE | ORAL | Status: DC | PRN

## 2021-08-20 MED ORDER — BUPIVACAINE IN DEXTROSE 0.75-8.25 % IT SOLN
INTRATHECAL | Status: DC | PRN
  Administered 2021-08-20: 1.8 mL via INTRATHECAL

## 2021-08-20 MED ORDER — PHENYLEPHRINE HCL-NACL 20-0.9 MG/250ML-% IV SOLN
INTRAVENOUS | Status: AC
  Filled 2021-08-20: qty 250

## 2021-08-20 MED ORDER — LACTATED RINGERS IV SOLN
INTRAVENOUS | Status: DC
Start: 2021-08-20 — End: 2021-08-20

## 2021-08-20 MED ORDER — KETOROLAC TROMETHAMINE 30 MG/ML IJ SOLN: 30.0000 mg | Freq: Four times a day (QID) | INTRAMUSCULAR | Status: DC | PRN

## 2021-08-20 MED ORDER — ONDANSETRON HCL 4 MG/2ML IJ SOLN
INTRAMUSCULAR | Status: AC
  Filled 2021-08-20: qty 2

## 2021-08-20 MED ORDER — PHENYLEPHRINE 80 MCG/ML (10ML) SYRINGE FOR IV PUSH (FOR BLOOD PRESSURE SUPPORT)
PREFILLED_SYRINGE | INTRAVENOUS | Status: DC | PRN
Start: 2021-08-20 — End: 2021-08-20

## 2021-08-20 MED ORDER — SIMETHICONE 80 MG PO CHEW
80.0000 mg | CHEWABLE_TABLET | ORAL | Status: DC | PRN
  Administered 2021-08-23: 80 mg via ORAL
  Filled 2021-08-20: qty 1

## 2021-08-20 MED ORDER — DIPHENHYDRAMINE HCL 25 MG PO CAPS: 25.0000 mg | ORAL_CAPSULE | Freq: Four times a day (QID) | ORAL | Status: DC | PRN

## 2021-08-20 MED ORDER — ACETAMINOPHEN 500 MG PO TABS
1000.0000 mg | ORAL_TABLET | Freq: Four times a day (QID) | ORAL | Status: DC
  Administered 2021-08-21 – 2021-08-23 (×10): 1000 mg via ORAL
  Filled 2021-08-20 (×9): qty 2

## 2021-08-20 MED ORDER — OXYTOCIN-SODIUM CHLORIDE 30-0.9 UT/500ML-% IV SOLN: 2.5000 [IU]/h | INTRAVENOUS | Status: AC

## 2021-08-20 MED ORDER — SCOPOLAMINE 1 MG/3DAYS TD PT72
1.0000 | MEDICATED_PATCH | TRANSDERMAL | Status: DC
  Administered 2021-08-20: 1.5 mg via TRANSDERMAL

## 2021-08-20 SURGICAL SUPPLY — 40 items
BARRIER ADHS 3X4 INTERCEED (GAUZE/BANDAGES/DRESSINGS) ×1 IMPLANT
BENZOIN TINCTURE PRP APPL 2/3 (GAUZE/BANDAGES/DRESSINGS) ×1 IMPLANT
CHLORAPREP W/TINT 26ML (MISCELLANEOUS) ×4 IMPLANT
CLAMP CORD UMBIL (MISCELLANEOUS) ×2 IMPLANT
CLOTH BEACON ORANGE TIMEOUT ST (SAFETY) ×2 IMPLANT
DRSG OPSITE POSTOP 4X10 (GAUZE/BANDAGES/DRESSINGS) ×2 IMPLANT
ELECT REM PT RETURN 9FT ADLT (ELECTROSURGICAL) ×2
ELECTRODE REM PT RTRN 9FT ADLT (ELECTROSURGICAL) ×1 IMPLANT
EXTRACTOR VACUUM KIWI (MISCELLANEOUS) IMPLANT
EXTRACTOR VACUUM M CUP 4 TUBE (SUCTIONS) IMPLANT
GLOVE BIO SURGEON STRL SZ7 (GLOVE) ×2 IMPLANT
GLOVE BIOGEL PI IND STRL 7.0 (GLOVE) ×1 IMPLANT
GLOVE BIOGEL PI INDICATOR 7.0 (GLOVE) ×1
GOWN STRL REUS W/TWL LRG LVL3 (GOWN DISPOSABLE) ×4 IMPLANT
HEMOSTAT ARISTA ABSORB 3G PWDR (HEMOSTASIS) ×1 IMPLANT
KIT ABG SYR 3ML LUER SLIP (SYRINGE) IMPLANT
NDL HYPO 25X5/8 SAFETYGLIDE (NEEDLE) IMPLANT
NEEDLE HYPO 25X5/8 SAFETYGLIDE (NEEDLE) IMPLANT
NS IRRIG 1000ML POUR BTL (IV SOLUTION) ×2 IMPLANT
PACK C SECTION WH (CUSTOM PROCEDURE TRAY) ×2 IMPLANT
PAD OB MATERNITY 4.3X12.25 (PERSONAL CARE ITEMS) ×2 IMPLANT
RTRCTR C-SECT PINK 25CM LRG (MISCELLANEOUS) IMPLANT
STRIP CLOSURE SKIN 1/2X4 (GAUZE/BANDAGES/DRESSINGS) ×1 IMPLANT
SUT MNCRL 0 VIOLET CTX 36 (SUTURE) ×2 IMPLANT
SUT MONOCRYL 0 CTX 36 (SUTURE) ×3
SUT PLAIN 0 NONE (SUTURE) IMPLANT
SUT PLAIN 2 0 (SUTURE)
SUT PLAIN ABS 2-0 CT1 27XMFL (SUTURE) IMPLANT
SUT PROLENE 1 CT (SUTURE) ×1 IMPLANT
SUT VIC AB 0 CT1 27 (SUTURE) ×3
SUT VIC AB 0 CT1 27XBRD ANBCTR (SUTURE) ×2 IMPLANT
SUT VIC AB 2-0 CT1 27 (SUTURE) ×1
SUT VIC AB 2-0 CT1 TAPERPNT 27 (SUTURE) ×1 IMPLANT
SUT VIC AB 2-0 SH 27 (SUTURE) ×2
SUT VIC AB 2-0 SH 27XBRD (SUTURE) ×2 IMPLANT
SUT VIC AB 4-0 KS 27 (SUTURE) ×2 IMPLANT
SUT VICRYL 0 TIES 12 18 (SUTURE) IMPLANT
TOWEL OR 17X24 6PK STRL BLUE (TOWEL DISPOSABLE) ×2 IMPLANT
TRAY FOLEY W/BAG SLVR 14FR LF (SET/KITS/TRAYS/PACK) IMPLANT
WATER STERILE IRR 1000ML POUR (IV SOLUTION) ×2 IMPLANT

## 2021-08-20 NOTE — Lactation Note (Signed)
This note was copied from a baby's chart. ?Lactation Consultation Note ?Reverse pressure performed to Lt. Breast. Noted some helpful. Fitted mom #20 NS. Attempted to latch baby. Baby has no interest in BF. Hand expressed thick dot of colostrum and spoon fed to baby. Baby didn't cue to feed. ?Discussed w/mom pumping. Mom agreed. ?Mom shown how to use DEBP & how to disassemble, clean, & reassemble parts. Mom knows to pump q3h for 15-20 min. Mom encouraged to feed baby 8-12 times/24 hours and with feeding cues.   ?Mom had colostrum on flange. LC used gloved finger and gave baby colostrum from flange. ? ?Plan: ?Wear shells during the day in am. ?Pre-pump to evert nipple so defines nipple boarders to apply NS. ?Needs NS in order to latch at this time. ?Hand express colostrum and spoon feed baby to stimulate baby to want to feed if not cueing. ?If only pumps briefly for pre-pumping, post pump after feeding q3h. ? ?Patient Name: Lori Carson ?Today's Date: 08/20/2021 ?Reason for consult: Initial assessment;Primapara;Early term 37-38.6wks ?Age:9 hours ? ?Maternal Data ?Has patient been taught Hand Expression?: Yes ?Does the patient have breastfeeding experience prior to this delivery?: No ? ?Feeding ?  ? ?LATCH Score ?Latch: Repeated attempts needed to sustain latch, nipple held in mouth throughout feeding, stimulation needed to elicit sucking reflex. ? ?Audible Swallowing: None ? ?Type of Nipple: Flat ? ?Comfort (Breast/Nipple): Soft / non-tender ? ?Hold (Positioning): Assistance needed to correctly position infant at breast and maintain latch. ? ?LATCH Score: 5 ? ? ?Lactation Tools Discussed/Used ?Tools: Nipple [employer];Shells ?Nipple shield size: 20 ?Flange Size: 24 ?Breast pump type: Double-Electric Breast Pump ?Pump Education: Setup, frequency, and cleaning;Milk Storage ?Reason for Pumping: flat/NS ?Pumping frequency: q3h ?Pumped volume:  (smear) ? ?Interventions ?Interventions: Breast feeding basics  reviewed;Assisted with latch;Skin to skin;Breast massage;Hand express;Pre-pump if needed;Reverse pressure;Breast compression;Adjust position;Support pillows;Position options;Expressed milk;DEBP;LC Services brochure;Shells ? ?Discharge ?  ? ?Consult Status ?Consult Status: Follow-up ?Date: 08/21/21 ?Follow-up type: In-patient ? ? ? ?Theodoro Kalata ?08/20/2021, 11:03 PM ? ? ? ?

## 2021-08-20 NOTE — Anesthesia Postprocedure Evaluation (Signed)
Anesthesia Post Note ? ?Patient: Lori Carson ? ?Procedure(s) Performed: Primary CESAREAN SECTION ? ?  ? ?Patient location during evaluation: PACU ?Anesthesia Type: Spinal ?Level of consciousness: oriented and awake and alert ?Pain management: pain level controlled ?Vital Signs Assessment: post-procedure vital signs reviewed and stable ?Respiratory status: spontaneous breathing, respiratory function stable and nonlabored ventilation ?Cardiovascular status: blood pressure returned to baseline and stable ?Postop Assessment: no headache, no backache, no apparent nausea or vomiting and spinal receding ?Anesthetic complications: no ? ? ?No notable events documented. ? ?Last Vitals:  ?Vitals:  ? 08/20/21 1940 08/20/21 2038  ?BP: 115/63 116/81  ?Pulse: (!) 50 (!) 48  ?Resp: 16 16  ?Temp: 36.5 ?C 36.5 ?C  ?SpO2: 100% 99%  ?  ?Last Pain:  ?Vitals:  ? 08/20/21 2039  ?TempSrc:   ?PainSc: 0-No pain  ? ?Pain Goal:   ? ?  ?  ?  ?  ?  ?  ?Epidural/Spinal Function Cutaneous sensation: Normal sensation (08/20/21 2039), Patient able to flex knees: Yes (08/20/21 2039), Patient able to lift hips off bed: Yes (08/20/21 2039), Back pain beyond tenderness at insertion site: No (08/20/21 2039), Progressively worsening motor and/or sensory loss: No (08/20/21 2039), Bowel and/or bladder incontinence post epidural: No (08/20/21 2039) ? ?Lidia Collum ? ? ? ? ?

## 2021-08-20 NOTE — Op Note (Signed)
Cesarean Section Procedure Note Lori Carson ? ?08/20/2021 ? ?Indications:  Elective primary c-section for history of myomectomy   ? ?Procedure: Primary Low Transverse C-section  ? ?Pre-operative Diagnosis: History of Myomectomy. 37.5 weeks  ? ?Post-operative Diagnosis: Same  ? ?Surgeon: Azucena Fallen, MD ? ?Assistants: Gavin Potters  ? ?Anesthesia: spinal  ? ?Procedure Details:  ?The patient was seen in the Holding Room. The risks, benefits, complications, treatment options, and expected outcomes were discussed with the patient. The patient concurred with the proposed plan, giving informed consent. identified as [name] and the procedure verified as C-Section Delivery. A Time Out was held and the above information confirmed. 2 gm Ancef given.  ?After induction of anesthesia, the patient was draped and prepped in the usual sterile manner, foley was draining urine well.  A pfannenstiel incision was made and carried down through the subcutaneous tissue to the fascia. Fascial incision was made and extended transversely. The fascia was separated from the underlying rectus tissue superiorly and inferiorly. The peritoneum was identified and entered. Peritoneal incision was extended longitudinally. Alexis-O retractor placed. Bladder adhesions noted but mostly involving visceral peritoneum. The utero-vesical peritoneal reflection was incised to release adhesions and bladder pushed down gently to expose lower uterine segment.  A low transverse uterine incision was made. Amniotomy noted clear fluid. Lower segment was thick due to right lateral anterior myoma. Delivered from cephalic presentation was a Female  infant with vigorous cry. Apgar scores of 8 at one minute and 9 at five minutes. Delayed cord clamping done at 1 minute and baby handed to NICU team in attendance. Cord ph was not sent. Cord blood was obtained for evaluation. The placenta was removed Intact and appeared normal. The uterine outline, tubes and  ovaries appeared normal}. The uterine incision was closed with running locked sutures of 0Monocryl. A second imbricating layer sutured.   Oozing noted in bladder dissection area and mostly controlled with cautery but Arista sprayed for further hemostasis. Alexis retractor removed. Interceed placed on hysterotomy closure. Peritoneal closure done with 2-0 Vicryl.  The fascia was then reapproximated with running sutures of 0Vicryl. The subcuticular closure was performed using 2-0plain gut. The skin was closed with 4-0Vicryl. Steristrips, honeycomb and pressure dressing placed.  ?Instrument, sponge, and needle counts were correct prior the abdominal closure and were correct at the conclusion of the case.  ? ? ?Findings:  Female infant delivered cephalic from low transverse hysterotomy, 2 layer closure. Apgar 8 and 9. Bladder adhesions were peritoneal and dissected without complications.  ?  ?Estimated Blood Loss:  648 cc ? ?Total IV Fluids: LR 2300 ml  ? ?Urine Output:  150 CC OF clear urine ? ?Specimens: cord blood  ? ?Complications: no complications ? ?Disposition: PACU - hemodynamically stable.  ? ?Maternal Condition: stable  ? ?Baby condition / location:  Couplet care / Skin to Skin ? ?Attending Attestation: I performed the procedure.  ? ?Signed: ?Surgeon(s): ?Azucena Fallen, MD ? ? ? ?   ?

## 2021-08-20 NOTE — Transfer of Care (Signed)
Immediate Anesthesia Transfer of Care Note ? ?Patient: Lori Carson ? ?Procedure(s) Performed: Primary CESAREAN SECTION ? ?Patient Location: PACU ? ?Anesthesia Type:Spinal ? ?Level of Consciousness: awake, alert  and oriented ? ?Airway & Oxygen Therapy: Patient Spontanous Breathing ? ?Post-op Assessment: Report given to RN and Post -op Vital signs reviewed and stable ? ?Post vital signs: Reviewed and stable ? ?Last Vitals:  ?Vitals Value Taken Time  ?BP    ?Temp    ?Pulse    ?Resp    ?SpO2    ? ? ?Last Pain:  ?Vitals:  ? 08/20/21 1208  ?TempSrc: Oral  ?   ? ?  ? ?Complications: No notable events documented. ?

## 2021-08-20 NOTE — Lactation Note (Signed)
This note was copied from a baby's chart. ?Lactation Consultation Note ?LC was assessing mom's breast tissue noting edema, non-compressible breast. Reverse pressure just a little helpful. ?Discussed w/mom using NS, mom in agreement. Baby hasn't latched to breast yet. ?Company came into room. Will gather supplies and go back when company leaves. ? ?Patient Name: Lori Carson ?Today's Date: 08/20/2021 ?  ?Age:31 hours ? ?Maternal Data ?  ? ?Feeding ?  ? ?LATCH Score ?  ? ?  ? ?  ? ?  ? ?  ? ?  ? ? ?Lactation Tools Discussed/Used ?  ? ?Interventions ?  ? ?Discharge ?  ? ?Consult Status ?  ? ? ? ?Theodoro Kalata ?08/20/2021, 8:10 PM ? ? ? ?

## 2021-08-20 NOTE — H&P (Signed)
Lori Carson is a 31 y.o. female presenting for primary C-section for h/o myomectomy  ?G1, 37.5 wks. Dating by 1st trim sono c/w LMP.  ?Good PNCare, nl labs, no DM or HTN. +FMs. Off and on contractions/ painful cramps but did well. No LOF/ VB  ?Fibroids, GERD, Anemia  ?Growth sono in 3rd trim - AGA ? ?OB History   ? ? Gravida  ?1  ? Para  ?   ? Term  ?   ? Preterm  ?   ? AB  ?   ? Living  ?   ?  ? ? SAB  ?   ? IAB  ?   ? Ectopic  ?   ? Multiple  ?   ? Live Births  ?   ?   ?  ?  ? ?Past Medical History:  ?Diagnosis Date  ? Anemia   ? borderline  ? Asthma   ? Chest pain   ? left arm numbness but can take tums and goes away  ? Fibroids   ? GERD (gastroesophageal reflux disease)   ? ?Past Surgical History:  ?Procedure Laterality Date  ? MYOMECTOMY N/A 07/08/2016  ? Procedure: MYOMECTOMY with peritoneal biopsy;  Surgeon: Everett Graff, MD;  Location: [facility];  Service: Gynecology;  Laterality: N/A;  ? MYOMECTOMY N/A 12/23/2019  ? Procedure: ABDOMINAL MYOMECTOMY;  Surgeon: Azucena Fallen, MD;  Location: [facility];  Service: Gynecology;  Laterality: N/A;  ? MYOMECTOMY  12/23/2019  ? OVARIAN CYST REMOVAL N/A 07/08/2016  ? Procedure: OVARIAN CYSTECTOMY right, left paratubal cystectomy;  Surgeon: Everett Graff, MD;  Location: [facility];  Service: Gynecology;  Laterality: N/A;  ? TONSILLECTOMY    ? UPPER GI ENDOSCOPY    ? WISDOM TOOTH EXTRACTION    ? ?Family History: family history includes Asthma in her brother, father, mother, and paternal grandfather; Cancer in her maternal aunt and maternal grandmother; Diabetes in her maternal grandfather, maternal grandmother, paternal grandfather, and paternal grandmother; Heart disease in her paternal grandfather; Hypertension in her maternal grandfather, maternal grandmother, paternal grandfather, and paternal grandmother. ?Social History:  reports that she has never smoked. She has never used smokeless tobacco. She reports current alcohol use. She reports that she does not use  drugs. ? ? ?  ?Maternal Diabetes: No ?Genetic Screening: Normal ?Maternal Ultrasounds/Referrals: Normal ?Fetal Ultrasounds or other Referrals:  None ?Maternal Substance Abuse:  No ?Significant Maternal Medications:  Meds include: Other: Pantoprazole ?Significant Maternal Lab Results:  Group B Strep positive ?Other Comments:  None ? ?Review of Systems ?History ?  ?Last menstrual period 10/05/2020. ?Exam ?Physical Exam  ?BP 129/87   Pulse 78   Temp 98.9 ?F (37.2 ?C) (Oral)   Resp 18   Ht '5\' 7"'$  (1.702 m)   Wt 88.9 kg   LMP 10/05/2020   SpO2 98%   BMI 30.70 kg/m?  ?Physical exam:  ?A&O x 3, no acute distress. Pleasant ?HEENT neg, no thyromegaly ?Lungs CTA bilat ?CV RRR, S1S2 normal ?Abdo soft, non tender, non acute ?Extr no edema/ tenderness ?Pelvic Cx closed long ?FHT  130s ?Toco rare per pt  ? ?Prenatal labs: ?ABO, Rh: --/--/B POS (04/21 9628) ?Antibody: NEG (04/21 3662) ?Rubella:  Imm ?RPR: NON REACTIVE (04/21 0917)  ?HBsAg:   neg ?HIV:   neg ?GBS: Positive/-- (04/06 0000)  ?Glucola nl ?NIPT low risk, Female  ?AFP1 neg ? ?Assessment/Plan: ?31 yo G1 at 37.5 wks here for primary C-section due to h/o myomectomy x2.  ?Risks/complications of surgery reviewed  incl infection, bleeding, damage to internal organs including bladder, bowels, ureters, blood vessels, other risks from anesthesia, VTE and delayed complications of any surgery, complications in future surgery reviewed. Also discussed neonatal complications incl difficult delivery, laceration, vacuum assistance, TTN etc. Pt understands and agrees, all concerns addressed.   ?  ? ?Elveria Royals ?08/20/2021, 12:04 PM ? ? ? ? ?

## 2021-08-21 ENCOUNTER — Encounter (HOSPITAL_COMMUNITY): Payer: Self-pay | Admitting: Obstetrics & Gynecology

## 2021-08-21 LAB — CBC
HCT: 32.7 % — ABNORMAL LOW (ref 36.0–46.0)
Hemoglobin: 10.8 g/dL — ABNORMAL LOW (ref 12.0–15.0)
MCH: 27.1 pg (ref 26.0–34.0)
MCHC: 33 g/dL (ref 30.0–36.0)
MCV: 82 fL (ref 80.0–100.0)
Platelets: 192 10*3/uL (ref 150–400)
RBC: 3.99 MIL/uL (ref 3.87–5.11)
RDW: 14.9 % (ref 11.5–15.5)
WBC: 16.1 10*3/uL — ABNORMAL HIGH (ref 4.0–10.5)
nRBC: 0 % (ref 0.0–0.2)

## 2021-08-21 NOTE — Lactation Note (Addendum)
This note was copied from a baby's chart. ?Lactation Consultation Note ? ?Patient Name: Lori Carson ?Today's Date: 08/21/2021 ?Reason for consult: Follow-up assessment;Mother's request;Difficult latch;Early term 37-38.6wks;Breastfeeding assistance; RN request ?Age:31 hours ? ?Infant latched with help of 24 NS primed with DBM via curve tip. LC observed a 10 minute feeding with signs of milk transfer.  ? ?Plan 1. To feed based on cues 8-12x 24hr period. Mom to offer breasts and if needed use 24 NS noting signs of milk transfer.  ?2. Mom to supplement with EBM first followed by DBM 7-12 ml per feeding with yellow slow flow nipple and pace bottle feeding.  ?3. DEBP q 3hrs for 15 min  ?All questions answered at the end of the visit.  ? ? ?Maternal Data ?  ? ?Feeding ?Mother's Current Feeding Choice: Breast Milk and Donor Milk ? ?LATCH Score ?Latch: Repeated attempts needed to sustain latch, nipple held in mouth throughout feeding, stimulation needed to elicit sucking reflex. ? ?Audible Swallowing: Spontaneous and intermittent ? ?Type of Nipple: Flat ? ?Comfort (Breast/Nipple): Soft / non-tender ? ?Hold (Positioning): Assistance needed to correctly position infant at breast and maintain latch. ? ?LATCH Score: 7 ? ? ?Lactation Tools Discussed/Used ?Tools: Pump;Flanges;Shells ?Nipple shield size: 20 ?Flange Size: 24 ?Breast pump type: Double-Electric Breast Pump ?Pump Education: Setup, frequency, and cleaning;Milk Storage ?Reason for Pumping: increase stimulation ?Pumping frequency: every 3 hrs for 15 min ? ?Interventions ?Interventions: Breast feeding basics reviewed;Assisted with latch;Skin to skin;Breast massage;Hand express;Pre-pump if needed;Breast compression;Adjust position;Support pillows;Position options;Expressed milk;DEBP;Education;Pace feeding;LC Magazine features editor;Infant Driven Feeding Algorithm education ? ?Discharge ?Pump: DEBP ? ?Consult Status ?Consult Status: Follow-up ?Date: 08/22/21 ?Follow-up  type: In-patient ? ? ? ?Corsica Franson  Nicholson-Springer ?08/21/2021, 4:48 PM ? ? ? ?

## 2021-08-21 NOTE — TOC Progression Note (Signed)
Transition of Care (TOC) - Progression Note  ? ? ?Patient Details  ?Name: Lori Carson ?MRN: [id number] ?Date of Birth: 1990/05/02 ? ?Transition of Care (TOC) CM/SW Contact  ?Coralee Pesa, LCSWA ?Phone Number: ?08/21/2021, 10:01 AM ? ?Clinical Narrative:    ? ?CSW met with mother and baby at bedside. Mother's brother and mom were at bedside as well, they stepped out for assessment. MOB confirms she has struggled with anxiety and depression in the past, but feels it is well controlled. MOB notes she has PRN lexapro, but has not needed it.  ? ?MOB states that she has utilized exercise and family supports in the past to help manage her symptoms. MOB states her parents, brother, and other extended family members are very supportive. MOB states she has everything she needs to care for baby at home. MOB has a bassinet/ crib; SIDS and safe sleep were discussed. MOB was advised to have nothing in the crib with baby and to always put him to sleep on his back with no blankets. Swaddles and other sleeping questions were answered.  ? ?CSW introduced symptoms of PPD and provided a resource for MOB to continue to check in with to identify symptoms. Baby Blues and PPD were discussed. MOB was advised that she should seek medical professional help if she experiences HI or SI. ?MOB denies DV, HI, SI, or any concerns about taking baby home. MOB appropriate and asking thoughtful questions. TOC will be available for any further needs. ? ?Coralee Pesa MSW, [place] ?Float CSW ? ?  ?  ? ?Expected Discharge Plan and Services ?  ?  ?  ?  ?  ?                ?  ?  ?  ?  ?  ?  ?  ?  ?  ?  ? ? ?Social Determinants of Health (SDOH) Interventions ?  ? ?Readmission Risk Interventions ?   ? View : No data to display.  ?  ?  ?  ? ? ?

## 2021-08-21 NOTE — Progress Notes (Signed)
Subjective: ?POD# 1 ?Live born female  ?Birth Weight: 6 lb 14.4 oz (3130 g) ?APGAR: 8, 9 ? ?Newborn Delivery   ?Birth date/time: 08/20/2021 17:20:00 ?Delivery type: C-Section, Low Transverse ?Trial of labor: No ?C-section categorization: Primary ?  ?  ?Baby name: Cayden ? ?Delivering provider: MODY, VAISHALI  ? ?Circumcision Yes, planning ? ?Feeding: breast and bottle ? ?Pain control at delivery: Spinal  ? ?Reports feeling well with no complaints.  ? ?Patient reports tolerating PO.   ?Pain controlled with acetaminophen, ibuprofen (OTC), and prescription NSAID's including ketorolac (Toradol) ?Denies HA/SOB/C/P/N/V/dizziness. ?She reports vaginal bleeding as normal, without clots. She is ambulating and urinating without difficulty.    ? ?Objective:  ?Vitals:  ? 08/20/21 2250 08/21/21 0238 08/21/21 0605 08/21/21 1015  ?BP: 116/75 106/63  (!) 101/59  ?Pulse: (!) 55 (!) 58  (!) 58  ?Resp: '16 16 16 18  '$ ?Temp: 97.9 ?F (36.6 ?C) 97.8 ?F (36.6 ?C) 97.8 ?F (36.6 ?C)   ?TempSrc: Oral Oral Oral   ?SpO2: 99% 97% 98% 98%  ?Weight:      ?Height:      ?  ?Intake/Output Summary (Last 24 hours) at 08/21/2021 1225 ?Last data filed at 08/21/2021 1015 ?Gross per 24 hour  ?Intake 2735.81 ml  ?Output 2623 ml  ?Net 112.81 ml  ?    ?Recent Labs  ?  08/21/21 ?0427  ?WBC 16.1*  ?HGB 10.8*  ?HCT 32.7*  ?PLT 192  ? ? Blood type: --/--/B POS (04/21 6503) ? Rubella: Immune (10/28 0000)  ? ?Physical Exam:  ?General: alert and cooperative ?CV: Regular rate and rhythm ?Resp: clear ?Abdomen: soft, nontender, normal bowel sounds ?Incision: clean and dry ?Uterine Fundus: firm, below umbilicus, nontender ?Lochia: minimal ?Ext: extremities normal, atraumatic, no cyanosis or edema ? ?Assessment/Plan: ?31 y.o.   POD# 1. [id number]  ?                ?Principal Problem: ?  Postpartum care following cesarean delivery 4/24 ? Encourage rest when baby rests ?Breastfeeding support ?Encourage to ambulate ?Routine post-op care ?Active Problems: ?  Iron deficiency  anemia ? HGB stable this AM at 10.8 ?  S/P myomectomy ?  Primary low transverse cesarean section - previous myomectomy x 2 ?  History of myomectomy ? ?Zettie Pho, MSN ?08/21/2021, 12:25 PM ? ?  ?

## 2021-08-22 NOTE — Lactation Note (Signed)
This note was copied from a baby's chart. ?Lactation Consultation Note ? ?Patient Name: Lori Carson ?Today's Date: 08/22/2021 ?  ?Age:31 hours ? ?Lactation attempted consult, mother sleeping.  Lactation to follow up later today.  ? ? ?Lori Carson ?08/22/2021, 12:31 PM ? ? ? ?

## 2021-08-22 NOTE — Progress Notes (Signed)
? ?  Subjective: ?POD# 2 ?Live born female  ?Birth Weight: 6 lb 14.4 oz (3130 g) ?APGAR: 8, 9 ? ?Newborn Delivery   ?Birth date/time: 08/20/2021 17:20:00 ?Delivery type: C-Section, Low Transverse ?Trial of labor: No ?C-section categorization: Primary ?  ?  ?Baby name: Cayden ?Delivering provider: MODY, VAISHALI  ? ?circumcision pending ?Feeding: breast ? ?Pain control at delivery: Spinal  ? ?Reports feeling well, some pain last night but better with meds.  ? ?Patient reports tolerating PO.   ?Breast symptoms:working on latch ?Pain controlled with  PO meds ?Denies HA/SOB/C/P/N/V/dizziness. Flatus present. ?She reports vaginal bleeding as normal, without clots.  She is ambulating, urinating without difficulty.    ? ?Objective: ?  VS:  ?  ?Vitals:  ? 08/21/21 0605 08/21/21 1015 08/21/21 2134 08/22/21 0630  ?BP:  (!) 101/59 120/74 117/69  ?Pulse:  (!) 58 64 61  ?Resp: '16 18 18 18  '$ ?Temp: 97.8 ?F (36.6 ?C)  98 ?F (36.7 ?C) 97.9 ?F (36.6 ?C)  ?TempSrc: Oral  Oral Oral  ?SpO2: 98% 98% 99% 100%  ?Weight:      ?Height:      ? ?  ?Intake/Output Summary (Last 24 hours) at 08/22/2021 0915 ?Last data filed at 08/21/2021 2143 ?Gross per 24 hour  ?Intake --  ?Output 1550 ml  ?Net -1550 ml  ?   ? ?  ?Recent Labs  ?  08/21/21 ?0427  ?WBC 16.1*  ?HGB 10.8*  ?HCT 32.7*  ?PLT 192  ? ? ? Blood type: --/--/B POS (04/21 7741) ? Rubella: Immune (10/28 0000)  ? ? Physical Exam:  ?General: alert, cooperative, and no distress ?Abdomen: soft, nontender, normal bowel sounds ?Incision: clean, dry, intact, and pressure dressing in place ?Uterine Fundus: firm, below umbilicus, nontender ?Lochia: minimal ?Ext: no edema, redness or tenderness in the calves or thighs ? ?Assessment/Plan: ?31 y.o.   POD# 2. G1P1001  ?                ?Principal Problem: ?  Postpartum care following cesarean delivery 4/24 ?Active Problems: ?  Iron deficiency anemia ?  S/P myomectomy ?  Primary low transverse cesarean section - previous myomectomy x 2 ?  History of  myomectomy ? ? ?Doing well, stable.    ? DC IV and pressure dressing today, shower          ?Advance diet as tolerated ?Encourage rest when baby rests ?Breastfeeding support ?Encourage to ambulate ?Routine post-op care ? ?Juliene Pina, CNM, MSN ?08/22/2021, 9:15 AM ? ?  ?

## 2021-08-22 NOTE — Lactation Note (Signed)
This note was copied from a baby's chart. ?Lactation Consultation Note ? ?Patient Name: Lori Carson ?Today's Date: 08/22/2021 ?Reason for consult: Follow-up assessment ?Age:31 hours ? ?P1, Mother finishing pumping with DEBP as LC entered room.  Mother pumped 5 ml.  Discussed pumping and supplementing with donor milk. ?Suggest mother call for latch assistance as needed. ?Reviewed volume guidelines and milk storage. ? ?Feeding ?Mother's Current Feeding Choice: Breast Milk and Formula ?Nipple Type: Slow - flow ? ? ?Lactation Tools Discussed/Used ?Tools: Pump;Flanges ?Flange Size: 24 ?Breast pump type: Double-Electric Breast Pump ?Pump Education: Milk Storage ?Reason for Pumping: stimulation and supplementation ?Pumping frequency: q 3 hr ?Pumped volume: 5 mL ? ?Interventions ?Interventions: Education;DEBP ? ?Discharge ?Pump: DEBP (Ameda) ? ?Consult Status ?Consult Status: Follow-up ?Date: 08/23/21 ?Follow-up type: In-patient ? ? ? ?Vivianne Master Boschen ?08/22/2021, 2:14 PM ? ? ? ?

## 2021-08-23 MED ORDER — HYDROMORPHONE HCL 2 MG PO TABS: 2.0000 mg | ORAL_TABLET | Freq: Four times a day (QID) | ORAL | 0 refills | Status: AC | PRN

## 2021-08-23 MED ORDER — SENNOSIDES-DOCUSATE SODIUM 8.6-50 MG PO TABS: 2.0000 | ORAL_TABLET | Freq: Every day | ORAL | Status: AC

## 2021-08-23 MED ORDER — SIMETHICONE 80 MG PO CHEW: 80.0000 mg | CHEWABLE_TABLET | ORAL | 0 refills | Status: AC | PRN

## 2021-08-23 MED ORDER — ALUM & MAG HYDROXIDE-SIMETH 200-200-20 MG/5ML PO SUSP
30.0000 mL | ORAL | Status: DC | PRN
  Administered 2021-08-23 (×3): 30 mL via ORAL
  Filled 2021-08-23 (×4): qty 30

## 2021-08-23 MED ORDER — IBUPROFEN 600 MG PO TABS: 600.0000 mg | ORAL_TABLET | Freq: Four times a day (QID) | ORAL | 0 refills | Status: AC

## 2021-08-23 MED ORDER — ALUM & MAG HYDROXIDE-SIMETH 200-200-20 MG/5ML PO SUSP: 30.0000 mL | ORAL | 0 refills | Status: AC | PRN

## 2021-08-23 MED ORDER — COCONUT OIL OIL: 1.0000 "application " | TOPICAL_OIL | 0 refills | Status: AC | PRN

## 2021-08-23 MED ORDER — PANTOPRAZOLE SODIUM 40 MG PO TBEC
40.0000 mg | DELAYED_RELEASE_TABLET | Freq: Every day | ORAL | Status: DC
Start: 2021-08-23 — End: 2021-08-23
  Administered 2021-08-23: 40 mg via ORAL

## 2021-08-23 NOTE — Lactation Note (Addendum)
This note was copied from a baby's chart. ?Lactation Consultation Note ? ?Patient Name: Lori Carson ?Today's Date: 08/23/2021 ?Reason for consult: Follow-up assessment;Engorgement;Early term 37-38.6wks ?Age:31 hours ? ?P1, 67w5dMother pumping upon entering.   ?Her breasts are fillng and are firm. ?Applied warm moist heat prior to pumping and and after pumping apply ice.  ?Suggest trying to latch baby to help empty breast. ?Per mother baby has been sleepy at the breast.   ? ?Returned to room to assist mother with breastfeeding. ?Baby latched off and on full breast.  Applied #20NS and baby was able to sustain latch for 20 min with transitional breastmilk in NS after feeding. ?Suggest breastfeeding and supplementing after with 20-30 ml, more if desired. ?Reviewed engorgement care and monitoring voids/stools. ? ? ?Feeding ?Mother's Current Feeding Choice: Breast Milk and Donor Milk ? ? ?Lactation Tools Discussed/Used ?Tools: Pump;Hands-free pumping top ?Pumping frequency: q 3 hr ?Pumped volume: 35 mL ? ?Interventions ?Interventions: Education;DEBP ? ?Discharge ?Discharge Education: Engorgement and breast care;Warning signs for feeding baby ?Pump: Personal;DEBP (Amerda) ? ?Consult Status ?Consult Status: Complete ?Date: 08/23/21 ? ? ? ?BVivianne MasterBoschen ?08/23/2021, 10:45 AM ? ? ? ?

## 2021-08-23 NOTE — Discharge Summary (Signed)
?OB Discharge Summary ? ?Patient Name: Lori Carson ?DOB: 1990-06-02 ?MRN: [id number] ? ?Date of admission: 08/20/2021 ?Delivering provider: MODY, VAISHALI  ? ?Admitting diagnosis: Status post primary low transverse cesarean section [Z98.891] ?History of myomectomy [Z98.890] ?Intrauterine pregnancy: [redacted]w[redacted]d    ?Secondary diagnosis: ?Patient Active Problem List  ? Diagnosis Date Noted  ? Postpartum care following cesarean delivery 4/24 08/20/2021  ? Primary low transverse cesarean section - previous myomectomy x 2 08/20/2021  ? History of myomectomy 08/20/2021  ? S/P myomectomy 12/23/2019  ? Iron deficiency anemia 08/17/2019  ? ?Additional problems:none  ? ?Date of discharge: 08/23/2021   ?Discharge diagnosis: Principal Problem: ?  Postpartum care following cesarean delivery 4/24 ?Active Problems: ?  Iron deficiency anemia ?  S/P myomectomy ?  Primary low transverse cesarean section - previous myomectomy x 2 ?  History of myomectomy ? ?                                                            ?Post partum procedures: none ? ?Augmentation: N/A ?Pain control: Spinal  ?Laceration:None  ?Episiotomy:None  ?Complications: None ? ?Hospital course:  Sceduled C/S   31y.o. yo G1P1001 at 368w5das admitted to the hospital 08/20/2021 for scheduled cesarean section with the following indication:Prior Uterine Surgery.Delivery details are as follows:  ?Membrane Rupture Time/Date: 5:19 PM ,08/20/2021   ?Delivery Method:C-Section, Low Transverse  ?Details of operation can be found in separate operative note.  Patient had an uncomplicated postpartum course.  She is ambulating, tolerating a regular diet, passing flatus, and urinating well. Patient is discharged home in stable condition on  08/23/21 ?       ?Newborn Data: ?Birth date:08/20/2021  ?Birth time:5:20 PM  ?Gender:Female  ?Living status:Living  ?Apgars:8 ,9  ?Weight:3130 g    ? ?Physical exam  ?Vitals:  ? 08/22/21 1531 08/22/21 2100 08/22/21 2350 08/23/21 0505  ?BP: 123/84  124/70 124/74 125/76  ?Pulse: 80 78 70 75  ?Resp: 18 18    ?Temp: 98 ?F (36.7 ?C) 98.1 ?F (36.7 ?C) 97.9 ?F (36.6 ?C)   ?TempSrc: Oral Oral    ?SpO2: 99% 97% 98%   ?Weight:      ?Height:      ? ?General: alert, cooperative, and no distress ?Lochia: appropriate ?Uterine Fundus: firm ?Incision: Healing well with no significant drainage, Dressing is clean, dry, and intact ?DVT Evaluation: No cords or calf tenderness. ?No significant calf/ankle edema. ?Labs: ?Lab Results  ?Component Value Date  ? WBC 16.1 (H) 08/21/2021  ? HGB 10.8 (L) 08/21/2021  ? HCT 32.7 (L) 08/21/2021  ? MCV 82.0 08/21/2021  ? PLT 192 08/21/2021  ? ? ?  Latest Ref Rng & Units 12/21/2019  ? 10:19 AM  ?CMP  ?Glucose 70 - 99 mg/dL 87    ?BUN 6 - 20 mg/dL 6    ?Creatinine 0.44 - 1.00 mg/dL 0.89    ?Sodium 135 - 145 mmol/L 141    ?Potassium 3.5 - 5.1 mmol/L 3.6    ?Chloride 98 - 111 mmol/L 110    ?CO2 22 - 32 mmol/L 21    ?Calcium 8.9 - 10.3 mg/dL 8.8    ? ? ?Discharge instruction:  ?per After Visit Summary,  ?[facility] booklet and  ?"[facility] ? ?After  Visit Meds:  ?Allergies as of 08/23/2021   ? ?   Reactions  ? Oxycodone Itching  ? ?  ? ?  ?Medication List  ?  ? ?TAKE these medications   ? ?acetaminophen 500 MG tablet ?Commonly known as: TYLENOL ?Take 500 mg by mouth every 6 (six) hours as needed (pain.). ?  ?alum & mag hydroxide-simeth 200-200-20 MG/5ML suspension ?Commonly known as: MAALOX/MYLANTA ?Take 30 mLs by mouth every 4 (four) hours as needed for indigestion or heartburn. ?  ?coconut oil Oil ?Apply 1 application. topically as needed. ?  ?ferrous sulfate 325 (65 FE) MG tablet ?Take 325 mg by mouth every other day. At night ?  ?HYDROmorphone 2 MG tablet ?Commonly known as: DILAUDID ?Take 1 tablet (2 mg total) by mouth every 6 (six) hours as needed for up to 7 days for severe pain ((when tolerating fluids)). ?  ?ibuprofen 600 MG tablet ?Commonly known as: ADVIL ?Take 1 tablet (600 mg total) by mouth  every 6 (six) hours. ?  ?multivitamin-prenatal 27-0.8 MG Tabs tablet ?Take 1 tablet by mouth in the morning. ?  ?pantoprazole 40 MG tablet ?Commonly known as: PROTONIX ?Take 40 mg by mouth daily as needed (indigestion/heartburn.). ?  ?senna-docusate 8.6-50 MG tablet ?Commonly known as: Senokot-S ?Take 2 tablets by mouth daily. ?Start taking on: August 24, 2021 ?  ?simethicone 80 MG chewable tablet ?Commonly known as: MYLICON ?Chew 1 tablet (80 mg total) by mouth as needed for flatulence. ?  ? ?  ? ? ?Diet: routine diet ? ?Activity: Advance as tolerated. Pelvic rest for 6 weeks.  ? ?Postpartum contraception: TBA in office ? ?Newborn Data: ?Live born female  ?Birth Weight: 6 lb 14.4 oz (3130 g) ?APGAR: 8, 9 ? ?Newborn Delivery   ?Birth date/time: 08[birth year]21 17:20:00 ?Delivery type: C-Section, Low Transverse ?Trial of labor: No ?C-section categorization: Primary ?  ?  ? named Cayden ?Baby Feeding: Breast ?Disposition:home with mother ?Circumcision: completed inpatient / Dr. Benjie Karvonen ? ? ?Delivery Report: ? ?Review the Delivery Report for details.   ? ?Follow up: ? Follow-up Information   ? ? Azucena Fallen, MD. Schedule an appointment as soon as possible for a visit in 6 week(s).   ?Specialty: Obstetrics and Gynecology ?Why: For Postpartum follow-up ?Contact information: ?[address] ?[place] [address] ?[phone] ? ? ?  ?  ? ?  ?  ? ?  ? ? ? ? ?Signed: ?Otilio Carpen, MSN ?08/23/2021, 10:39 AM ?  ?

## 2021-08-23 NOTE — Discharge Instructions (Signed)
Lactation outpatient support - home visit ° ° °Jessica Bowers, IBCLC (lactation consultant)  & Birth Doula ° °Phone (text or call): [phone] °Email: [email or web address] °[email or web address] ° ° °Linda Coppola °RN, MHA, IBCLC °at [facility]: Lactation Consultant ° °[email or web address] °Mail: [email or web address] °Tel: [phone] ° ° °Additional breastfeeding resources: ° °[facility] °https://ibconline.ca/information-sheets/ ° °[employer] [place] ° °www.lllofnc.org ° ° °Other Resources: ° °Chiropractic specialist  ° °Dr. Leanna Hastings °[email or web address] ° ° °Craniosacral therapy for baby ° °Erin Balkind  °[email or web address] ° °

## 2021-08-28 ENCOUNTER — Telehealth (HOSPITAL_COMMUNITY): Payer: Self-pay | Admitting: *Deleted

## 2021-08-28 NOTE — Telephone Encounter (Signed)
Hospital Discharge Follow-Up Call: ? ?Patient reports that she is well and has no concerns about her healing process.  EPDS today was 4 and she endorses this accurately reflects that she is doing well emotionally. ? ?Patient says that baby is well and she has no concerns about baby's health.  She reports that baby sleeps in a bassinet.  Reviewed ABCs of Safe Sleep. ?
